# Patient Record
Sex: Male | Born: 1960 | Race: White | Hispanic: No | Marital: Single | Smoking: Former smoker
Health system: Southern US, Community
[De-identification: ages and names within clinical notes are randomized; demographics above are authoritative.]

## PROBLEM LIST (undated history)

## (undated) DIAGNOSIS — J449 Chronic obstructive pulmonary disease, unspecified: Secondary | ICD-10-CM

## (undated) DIAGNOSIS — I10 Essential (primary) hypertension: Secondary | ICD-10-CM

## (undated) DIAGNOSIS — E119 Type 2 diabetes mellitus without complications: Secondary | ICD-10-CM

---

## 2003-10-17 ENCOUNTER — Other Ambulatory Visit: Payer: Self-pay

## 2008-03-10 ENCOUNTER — Emergency Department: Payer: Self-pay | Admitting: Emergency Medicine

## 2020-05-15 ENCOUNTER — Inpatient Hospital Stay
Admission: EM | Admit: 2020-05-15 | Discharge: 2020-06-20 | DRG: 208 | Disposition: E | Payer: Medicaid Other | Attending: Pulmonary Disease | Admitting: Pulmonary Disease

## 2020-05-15 ENCOUNTER — Emergency Department: Payer: Medicaid Other

## 2020-05-15 ENCOUNTER — Other Ambulatory Visit: Payer: Self-pay

## 2020-05-15 DIAGNOSIS — R531 Weakness: Secondary | ICD-10-CM

## 2020-05-15 DIAGNOSIS — N179 Acute kidney failure, unspecified: Secondary | ICD-10-CM

## 2020-05-15 DIAGNOSIS — J189 Pneumonia, unspecified organism: Secondary | ICD-10-CM

## 2020-05-15 DIAGNOSIS — I468 Cardiac arrest due to other underlying condition: Secondary | ICD-10-CM | POA: Diagnosis not present

## 2020-05-15 DIAGNOSIS — E11649 Type 2 diabetes mellitus with hypoglycemia without coma: Secondary | ICD-10-CM | POA: Diagnosis not present

## 2020-05-15 DIAGNOSIS — J9601 Acute respiratory failure with hypoxia: Secondary | ICD-10-CM | POA: Diagnosis present

## 2020-05-15 DIAGNOSIS — Z4659 Encounter for fitting and adjustment of other gastrointestinal appliance and device: Secondary | ICD-10-CM

## 2020-05-15 DIAGNOSIS — G9341 Metabolic encephalopathy: Secondary | ICD-10-CM | POA: Diagnosis not present

## 2020-05-15 DIAGNOSIS — E43 Unspecified severe protein-calorie malnutrition: Secondary | ICD-10-CM | POA: Diagnosis not present

## 2020-05-15 DIAGNOSIS — J80 Acute respiratory distress syndrome: Secondary | ICD-10-CM | POA: Diagnosis present

## 2020-05-15 DIAGNOSIS — E869 Volume depletion, unspecified: Secondary | ICD-10-CM | POA: Diagnosis present

## 2020-05-15 DIAGNOSIS — E871 Hypo-osmolality and hyponatremia: Secondary | ICD-10-CM | POA: Diagnosis present

## 2020-05-15 DIAGNOSIS — Z91128 Patient's intentional underdosing of medication regimen for other reason: Secondary | ICD-10-CM

## 2020-05-15 DIAGNOSIS — J1282 Pneumonia due to Coronavirus disease 2019: Secondary | ICD-10-CM | POA: Diagnosis present

## 2020-05-15 DIAGNOSIS — I959 Hypotension, unspecified: Secondary | ICD-10-CM | POA: Diagnosis not present

## 2020-05-15 DIAGNOSIS — Z8249 Family history of ischemic heart disease and other diseases of the circulatory system: Secondary | ICD-10-CM

## 2020-05-15 DIAGNOSIS — N189 Chronic kidney disease, unspecified: Secondary | ICD-10-CM

## 2020-05-15 DIAGNOSIS — E1122 Type 2 diabetes mellitus with diabetic chronic kidney disease: Secondary | ICD-10-CM | POA: Diagnosis present

## 2020-05-15 DIAGNOSIS — E876 Hypokalemia: Secondary | ICD-10-CM | POA: Diagnosis present

## 2020-05-15 DIAGNOSIS — Z66 Do not resuscitate: Secondary | ICD-10-CM | POA: Diagnosis not present

## 2020-05-15 DIAGNOSIS — IMO0002 Reserved for concepts with insufficient information to code with codable children: Secondary | ICD-10-CM

## 2020-05-15 DIAGNOSIS — R7989 Other specified abnormal findings of blood chemistry: Secondary | ICD-10-CM

## 2020-05-15 DIAGNOSIS — R0602 Shortness of breath: Secondary | ICD-10-CM

## 2020-05-15 DIAGNOSIS — E440 Moderate protein-calorie malnutrition: Secondary | ICD-10-CM | POA: Insufficient documentation

## 2020-05-15 DIAGNOSIS — Z452 Encounter for adjustment and management of vascular access device: Secondary | ICD-10-CM

## 2020-05-15 DIAGNOSIS — E1165 Type 2 diabetes mellitus with hyperglycemia: Secondary | ICD-10-CM | POA: Diagnosis present

## 2020-05-15 DIAGNOSIS — I10 Essential (primary) hypertension: Secondary | ICD-10-CM | POA: Diagnosis present

## 2020-05-15 DIAGNOSIS — E878 Other disorders of electrolyte and fluid balance, not elsewhere classified: Secondary | ICD-10-CM | POA: Diagnosis present

## 2020-05-15 DIAGNOSIS — T383X6A Underdosing of insulin and oral hypoglycemic [antidiabetic] drugs, initial encounter: Secondary | ICD-10-CM | POA: Diagnosis present

## 2020-05-15 DIAGNOSIS — R296 Repeated falls: Secondary | ICD-10-CM | POA: Diagnosis present

## 2020-05-15 DIAGNOSIS — R0902 Hypoxemia: Secondary | ICD-10-CM

## 2020-05-15 DIAGNOSIS — Z6822 Body mass index (BMI) 22.0-22.9, adult: Secondary | ICD-10-CM

## 2020-05-15 DIAGNOSIS — U071 COVID-19: Secondary | ICD-10-CM | POA: Diagnosis present

## 2020-05-15 DIAGNOSIS — J440 Chronic obstructive pulmonary disease with acute lower respiratory infection: Secondary | ICD-10-CM | POA: Diagnosis present

## 2020-05-15 DIAGNOSIS — E872 Acidosis: Secondary | ICD-10-CM | POA: Diagnosis not present

## 2020-05-15 DIAGNOSIS — Z87891 Personal history of nicotine dependence: Secondary | ICD-10-CM | POA: Diagnosis not present

## 2020-05-15 DIAGNOSIS — B342 Coronavirus infection, unspecified: Secondary | ICD-10-CM

## 2020-05-15 DIAGNOSIS — Z833 Family history of diabetes mellitus: Secondary | ICD-10-CM

## 2020-05-15 DIAGNOSIS — R059 Cough, unspecified: Secondary | ICD-10-CM

## 2020-05-15 DIAGNOSIS — E1365 Other specified diabetes mellitus with hyperglycemia: Secondary | ICD-10-CM | POA: Diagnosis not present

## 2020-05-15 HISTORY — DX: Essential (primary) hypertension: I10

## 2020-05-15 HISTORY — DX: Chronic obstructive pulmonary disease, unspecified: J44.9

## 2020-05-15 HISTORY — DX: Type 2 diabetes mellitus without complications: E11.9

## 2020-05-15 LAB — COMPREHENSIVE METABOLIC PANEL
ALT: 16 U/L (ref 0–44)
AST: 28 U/L (ref 15–41)
Albumin: 2.6 g/dL — ABNORMAL LOW (ref 3.5–5.0)
Alkaline Phosphatase: 84 U/L (ref 38–126)
Anion gap: 18 — ABNORMAL HIGH (ref 5–15)
BUN: 86 mg/dL — ABNORMAL HIGH (ref 6–20)
CO2: 17 mmol/L — ABNORMAL LOW (ref 22–32)
Calcium: 8.1 mg/dL — ABNORMAL LOW (ref 8.9–10.3)
Chloride: 92 mmol/L — ABNORMAL LOW (ref 98–111)
Creatinine, Ser: 5.78 mg/dL — ABNORMAL HIGH (ref 0.61–1.24)
GFR calc Af Amer: 12 mL/min — ABNORMAL LOW (ref >60)
GFR calc non Af Amer: 10 mL/min — ABNORMAL LOW (ref >60)
Glucose, Bld: 321 mg/dL — ABNORMAL HIGH (ref 70–99)
Potassium: 3.1 mmol/L — ABNORMAL LOW (ref 3.5–5.1)
Sodium: 127 mmol/L — ABNORMAL LOW (ref 135–145)
Total Bilirubin: 1.1 mg/dL (ref 0.3–1.2)
Total Protein: 6.8 g/dL (ref 6.5–8.1)

## 2020-05-15 LAB — CBC WITH DIFFERENTIAL/PLATELET
Abs Immature Granulocytes: 0.05 10*3/uL (ref 0.00–0.07)
Basophils Absolute: 0 10*3/uL (ref 0.0–0.1)
Basophils Relative: 0 %
Eosinophils Absolute: 0 10*3/uL (ref 0.0–0.5)
Eosinophils Relative: 0 %
HCT: 36.8 % — ABNORMAL LOW (ref 39.0–52.0)
Hemoglobin: 13.2 g/dL (ref 13.0–17.0)
Immature Granulocytes: 1 %
Lymphocytes Relative: 11 %
Lymphs Abs: 0.8 10*3/uL (ref 0.7–4.0)
MCH: 28.6 pg (ref 26.0–34.0)
MCHC: 35.9 g/dL (ref 30.0–36.0)
MCV: 79.7 fL — ABNORMAL LOW (ref 80.0–100.0)
Monocytes Absolute: 0.3 10*3/uL (ref 0.1–1.0)
Monocytes Relative: 4 %
Neutro Abs: 5.9 10*3/uL (ref 1.7–7.7)
Neutrophils Relative %: 84 %
Platelets: 219 10*3/uL (ref 150–400)
RBC: 4.62 MIL/uL (ref 4.22–5.81)
RDW: 12.7 % (ref 11.5–15.5)
Smear Review: NORMAL
WBC: 7 10*3/uL (ref 4.0–10.5)
nRBC: 0 % (ref 0.0–0.2)

## 2020-05-15 LAB — BLOOD GAS, ARTERIAL
Acid-base deficit: 10.7 mmol/L — ABNORMAL HIGH (ref 0.0–2.0)
Bicarbonate: 12.4 mmol/L — ABNORMAL LOW (ref 20.0–28.0)
FIO2: 1
O2 Saturation: 99.1 %
Patient temperature: 37
pCO2 arterial: 22 mmHg — ABNORMAL LOW (ref 32.0–48.0)
pH, Arterial: 7.36 (ref 7.350–7.450)
pO2, Arterial: 141 mmHg — ABNORMAL HIGH (ref 83.0–108.0)

## 2020-05-15 LAB — RESPIRATORY PANEL BY RT PCR (FLU A&B, COVID)
Influenza A by PCR: NEGATIVE
Influenza B by PCR: NEGATIVE
SARS Coronavirus 2 by RT PCR: POSITIVE — AB

## 2020-05-15 LAB — BRAIN NATRIURETIC PEPTIDE: B Natriuretic Peptide: 80.5 pg/mL (ref 0.0–100.0)

## 2020-05-15 MED ORDER — GUAIFENESIN-DM 100-10 MG/5ML PO SYRP
10.0000 mL | ORAL_SOLUTION | ORAL | Status: DC | PRN
  Filled 2020-05-15: qty 10

## 2020-05-15 MED ORDER — SODIUM CHLORIDE 0.9 % IV BOLUS
2000.0000 mL | Freq: Once | INTRAVENOUS | Status: AC
  Administered 2020-05-15: 2000 mL via INTRAVENOUS

## 2020-05-15 MED ORDER — ASCORBIC ACID 500 MG PO TABS
500.0000 mg | ORAL_TABLET | Freq: Every day | ORAL | Status: DC
  Administered 2020-05-16 – 2020-05-24 (×10): 500 mg via ORAL
  Filled 2020-05-15 (×11): qty 1

## 2020-05-15 MED ORDER — ENOXAPARIN SODIUM 40 MG/0.4ML ~~LOC~~ SOLN: 40.0000 mg | SUBCUTANEOUS | Status: DC

## 2020-05-15 MED ORDER — SODIUM CHLORIDE 0.9 % IV SOLN: INTRAVENOUS | Status: DC

## 2020-05-15 MED ORDER — HYDROCOD POLST-CPM POLST ER 10-8 MG/5ML PO SUER: 5.0000 mL | Freq: Two times a day (BID) | ORAL | Status: DC | PRN

## 2020-05-15 MED ORDER — ONDANSETRON HCL 4 MG/2ML IJ SOLN: 4.0000 mg | Freq: Four times a day (QID) | INTRAMUSCULAR | Status: DC | PRN

## 2020-05-15 MED ORDER — ZINC SULFATE 220 (50 ZN) MG PO CAPS
220.0000 mg | ORAL_CAPSULE | Freq: Every day | ORAL | Status: DC
  Administered 2020-05-16 – 2020-05-24 (×10): 220 mg via ORAL
  Filled 2020-05-15 (×10): qty 1

## 2020-05-15 MED ORDER — ONDANSETRON HCL 4 MG PO TABS: 4.0000 mg | ORAL_TABLET | Freq: Four times a day (QID) | ORAL | Status: DC | PRN

## 2020-05-15 MED ORDER — ENOXAPARIN SODIUM 30 MG/0.3ML ~~LOC~~ SOLN
30.0000 mg | SUBCUTANEOUS | Status: DC
  Administered 2020-05-16 – 2020-05-19 (×5): 30 mg via SUBCUTANEOUS
  Filled 2020-05-15 (×5): qty 0.3

## 2020-05-15 MED ORDER — FAMOTIDINE 20 MG PO TABS
10.0000 mg | ORAL_TABLET | Freq: Every day | ORAL | Status: DC
  Administered 2020-05-16 – 2020-05-24 (×9): 10 mg via ORAL
  Filled 2020-05-15 (×9): qty 1

## 2020-05-15 MED ORDER — METHYLPREDNISOLONE SODIUM SUCC 125 MG IJ SOLR
1.0000 mg/kg | Freq: Two times a day (BID) | INTRAMUSCULAR | Status: AC
  Administered 2020-05-16 – 2020-05-18 (×6): 76.875 mg via INTRAVENOUS
  Filled 2020-05-15 (×6): qty 2

## 2020-05-15 MED ORDER — SODIUM CHLORIDE 0.9 % IV SOLN
200.0000 mg | Freq: Once | INTRAVENOUS | Status: AC
  Administered 2020-05-16: 200 mg via INTRAVENOUS
  Filled 2020-05-15: qty 40
  Filled 2020-05-15: qty 200

## 2020-05-15 MED ORDER — FAMOTIDINE 20 MG PO TABS: 20.0000 mg | ORAL_TABLET | Freq: Two times a day (BID) | ORAL | Status: DC

## 2020-05-15 MED ORDER — SODIUM CHLORIDE 0.9 % IV SOLN
100.0000 mg | Freq: Every day | INTRAVENOUS | Status: AC
  Administered 2020-05-16 – 2020-05-19 (×4): 100 mg via INTRAVENOUS
  Filled 2020-05-15 (×5): qty 20

## 2020-05-15 MED ORDER — MAGNESIUM HYDROXIDE 400 MG/5ML PO SUSP
30.0000 mL | Freq: Every day | ORAL | Status: DC | PRN
  Filled 2020-05-15: qty 30

## 2020-05-15 MED ORDER — DEXAMETHASONE SODIUM PHOSPHATE 10 MG/ML IJ SOLN
10.0000 mg | Freq: Once | INTRAMUSCULAR | Status: AC
  Administered 2020-05-15: 10 mg via INTRAVENOUS
  Filled 2020-05-15: qty 1

## 2020-05-15 MED ORDER — SODIUM CHLORIDE 0.9 % IV SOLN
100.0000 mg | Freq: Once | INTRAVENOUS | Status: AC
  Administered 2020-05-15: 100 mg via INTRAVENOUS
  Filled 2020-05-15: qty 100

## 2020-05-15 MED ORDER — ASPIRIN EC 81 MG PO TBEC
81.0000 mg | DELAYED_RELEASE_TABLET | Freq: Every day | ORAL | Status: DC
  Administered 2020-05-16 – 2020-05-24 (×10): 81 mg via ORAL
  Filled 2020-05-15 (×10): qty 1

## 2020-05-15 MED ORDER — ACETAMINOPHEN 325 MG PO TABS
650.0000 mg | ORAL_TABLET | Freq: Four times a day (QID) | ORAL | Status: DC | PRN
  Administered 2020-05-16 – 2020-05-21 (×5): 650 mg via ORAL
  Filled 2020-05-15 (×6): qty 2

## 2020-05-15 MED ORDER — BARICITINIB 2 MG PO TABS: 4.0000 mg | ORAL_TABLET | Freq: Every day | ORAL | Status: DC

## 2020-05-15 MED ORDER — TRAZODONE HCL 50 MG PO TABS
25.0000 mg | ORAL_TABLET | Freq: Every evening | ORAL | Status: DC | PRN
  Administered 2020-05-17 – 2020-05-22 (×4): 25 mg via ORAL
  Filled 2020-05-15 (×4): qty 1

## 2020-05-15 MED ORDER — GUAIFENESIN ER 600 MG PO TB12
600.0000 mg | ORAL_TABLET | Freq: Two times a day (BID) | ORAL | Status: DC
  Administered 2020-05-16 – 2020-05-24 (×19): 600 mg via ORAL
  Filled 2020-05-15 (×19): qty 1

## 2020-05-15 MED ORDER — PREDNISONE 50 MG PO TABS
50.0000 mg | ORAL_TABLET | Freq: Every day | ORAL | Status: DC
  Administered 2020-05-19 – 2020-05-20 (×2): 50 mg via ORAL
  Filled 2020-05-15 (×2): qty 1

## 2020-05-15 NOTE — Progress Notes (Signed)
Remdesivir - Pharmacy Brief Note   O:  CXR: early infiltrate SpO2: 100% on 15 L NRB   A/P:  Remdesivir 200 mg IVPB once followed by 100 mg IVPB daily x 4 days.   Clovia Cuff, PharmD, BCPS Jun 13, 2020 9:40 PM

## 2020-05-15 NOTE — H&P (Signed)
[place]   PATIENT NAME: Jackson Stark    MR#:  [id number]  DATE OF BIRTH:  Dec 11, 1960  DATE OF ADMISSION:  06-12-2020  PRIMARY CARE PHYSICIAN: No primary care provider on file.   REQUESTING/REFERRING PHYSICIAN: Merwyn Katos, MD  CHIEF COMPLAINT:   Chief Complaint  Patient presents with  . Shortness of Breath    HISTORY OF PRESENT ILLNESS:  Jackson Stark  is a 59 y.o. Caucasian male with a known history of COPD, hypertension and type 2 diabetes mellitus, who presented to the emergency room with acute onset of worsening dyspnea with associated dry cough with generalized weakness over the last couple weeks.  He has had recurrent falls during this time.  He denied any injuries.  He admits to nasal congestion without sore throat or earache.  He denied any loss of taste or smell.  Denies any fever or chills.  No nausea or vomiting or diarrhea.  Upon presentation to the emergency room, pulse oximetry was 80% on room air and get up to 93% on 10 L of O2 by nonrebreather with oth one-point erwise normal vital signs though respiratory rate is on was up to 29.  Labs revealed VBG with pH of 7.36 with bicarbonate of 12.4.  CMP was remarkable for hyponatremia hypokalemia and hypochloremia with hyperglycemia and a BUN of 86 with a creatinine of 5.78, anion gap of 18 and low albumin of 2.6 with total protein of 6.8.  BNP was 80.5.  CBC was unremarkable.  COVID-19 PCR came back positive and influenza antigens were negative.  Blood cultures were drawn.  Portable chest x-ray showed early left basal infiltrate. EKG showed sinus tachycardia with a rate of 101 with LVH.  The patient was given IV Decadron and doxycycline and 2 L bolus of IV normal saline.  He will be admitted to a progressive unit for further evaluation and management.  PAST MEDICAL HISTORY:   Past Medical History:  Diagnosis Date  . COPD (chronic obstructive pulmonary disease) (HCC)   . Diabetes mellitus without complication  (HCC)   . Hypertension     PAST SURGICAL HISTORY:  He denied any previous surgeries. SOCIAL HISTORY:   Social History   Tobacco Use  . Smoking status: Former Games developer  . Smokeless tobacco: Never Used  Substance Use Topics  . Alcohol use: Not Currently    FAMILY HISTORY:  Positive for diabetes mellitus and hypertension in his father.   DRUG ALLERGIES:  Not on File  REVIEW OF SYSTEMS:   ROS As per history of present illness. All pertinent systems were reviewed above. Constitutional, HEENT, cardiovascular, respiratory, GI, GU, musculoskeletal, neuro, psychiatric, endocrine, integumentary and hematologic systems were reviewed and are otherwise negative/unremarkable except for positive findings mentioned above in the HPI.   MEDICATIONS AT HOME:   Prior to Admission medications   Not on File      VITAL SIGNS:  Blood pressure (!) 157/80, pulse 90, temperature 97.8 F (36.6 C), temperature source Oral, resp. rate (!) 26, height 5\' 10"  (1.778 m), weight 77.1 kg, SpO2 93 %.  PHYSICAL EXAMINATION:  Physical Exam  GENERAL:  59 y.o.-year-old Caucasian male patient lying in the bed with mild respiratory stress with conversational dyspnea. EYES: Pupils equal, round, reactive to light and accommodation. No scleral icterus. Extraocular muscles intact.  HEENT: Head atraumatic, normocephalic. Oropharynx and nasopharynx clear.  NECK:  Supple, no jugular venous distention. No thyroid enlargement, no tenderness.  LUNGS: Diminished bibasal breath sounds with left  basal crackles.   CARDIOVASCULAR: Regular rate and rhythm, S1, S2 normal. No murmurs, rubs, or gallops.  ABDOMEN: Soft, nondistended, nontender. Bowel sounds present. No organomegaly or mass.  EXTREMITIES: No pedal edema, cyanosis, or clubbing.  NEUROLOGIC: Cranial nerves II through XII are intact. Muscle strength 5/5 in all extremities. Sensation intact. Gait not checked.  PSYCHIATRIC: The patient is alert and oriented x 3.   Normal affect and good eye contact. SKIN: No obvious rash, lesion, or ulcer.   LABORATORY PANEL:   CBC Recent Labs  Lab 05/11/2020 1714  WBC 7.0  HGB 13.2  HCT 36.8*  PLT 219   ------------------------------------------------------------------------------------------------------------------  Chemistries  Recent Labs  Lab 05/06/2020 1714  NA 127*  K 3.1*  CL 92*  CO2 17*  GLUCOSE 321*  BUN 86*  CREATININE 5.78*  CALCIUM 8.1*  AST 28  ALT 16  ALKPHOS 84  BILITOT 1.1   ------------------------------------------------------------------------------------------------------------------  Cardiac Enzymes No results for input(s): TROPONINI in the last 168 hours. ------------------------------------------------------------------------------------------------------------------  RADIOLOGY:  DG Chest Port 1 View  Result Date: 04/24/2020 CLINICAL DATA:  Shortness of breath EXAM: PORTABLE CHEST 1 VIEW COMPARISON:  None. FINDINGS: Cardiac shadow is within normal limits. The lungs are well aerated bilaterally. Diffuse increased density is noted in the left base consistent with early infiltrate. No bony abnormality is noted. IMPRESSION: Early infiltrate in the left base. Electronically Signed   By: Alcide Clever M.D.   On: 05/14/2020 17:39      IMPRESSION AND PLAN:  1.  Acute hypoxemic respiratory failure secondary to COVID-19. -The patient will be admitted to a medically monitored isolation bed. -O2 protocol will be followed to keep O2 saturation above 93.   2.  Multifocal pneumonia secondary to COVID-19. -The patient will be admitted to an isolation monitored bed with droplet and contact precautions. -Given multifocal pneumonia we will empirically place the patient on IV Rocephin and Zithromax for possible bacterial superinfection only with elevated Procalcitonin. -The patient will be placed on scheduled Mucinex and as needed Tussionex. -We will avoid nebulization as much as we can,  give bronchodilator MDI if needed, and with deterioration of oxygenation try to avoid BiPAP/CPAP if possible.    -Will obtain sputum Gram stain culture and sensitivity and follow blood cultures. -O2 protocol will be followed. -We will follow CRP, ferritin, LDH and D-dimer. -Will follow manual differential for ANC/ALC ratio as well as follow troponin I and daily CBC with manual differential and CMP. - Will place the patient on IV Remdesivir and IV steroid therapy with IV Solu-Medrol with elevated inflammatory markers. -The patient will be placed on vitamin D3, vitamin C, zinc sulfate, p.o. Pepcid and aspirin. -We will follow his acute kidney injury to see if he would qualify for baricitinib at some point as his GFR currently is less than 15.  3.  Acute kidney injury. -This could be acute superimposed on chronic kidney disease. -He will be hydrated with IV normal saline and will follow his BMP.  4.  Hypokalemia, hyponatremia and hypochloremia.. -This likely secondary to volume depletion with associated anorexia. -She will be hydrated with IV normal saline and will follow his BMP.  5.  DVT prophylaxis. -Subcutaneous Lovenox.    All the records are reviewed and case discussed with ED provider. The plan of care was discussed in details with the patient (and family). I answered all questions. The patient agreed to proceed with the above mentioned plan. Further management will depend upon hospital course.   CODE STATUS:  Full code  Status is: Inpatient  Remains inpatient appropriate because:Ongoing diagnostic testing needed not appropriate for outpatient work up, Unsafe d/c plan, IV treatments appropriate due to intensity of illness or inability to take PO and Inpatient level of care appropriate due to severity of illness   Dispo: The patient is from: Home              Anticipated d/c is to: Home              Anticipated d/c date is: > 3 days              Patient currently is not  medically stable to d/c.   TOTAL TIME TAKING CARE OF THIS PATIENT: 55 minutes.    Hannah Beat M.D on 17-May-2020 at 7:54 PM  [facility]   From 7 PM-7 AM, contact night-coverage [email or web address]  CC: Primary care physician; No primary care provider on file.

## 2020-05-15 NOTE — ED Provider Notes (Signed)
[facility] Emergency Department Provider Note   ____________________________________________   First MD Initiated Contact with Patient Jun 01, 2020 1707     (approximate)  I have reviewed the triage vital signs and the nursing notes.   HISTORY  Chief Complaint Shortness of Breath    HPI Jackson Stark is a 59 y.o. male with a past medical history of COPD, diabetes, and hypertension who presents via EMS for increasing weakness over the last 2 weeks.  Patient states that he has been off all medications for his chronic medical conditions for the past 6 months due to losing his primary care physician and being unable to get new prescriptions.  EMS states that they found patient with oxygen saturations of 87 on room air that slightly improved with nonrebreather at 15 L.  Patient denies any exacerbating or relieving factors for this weakness and states that with any ambulation he becomes extremely lightheaded and "loses strength" in his bilateral lower extremities causing him to fall.  Patient states that he has had 5-10 falls per day over the last 2 weeks.  Patient denies any head trauma or loss of consciousness during these falls.         Past Medical History:  Diagnosis Date  . COPD (chronic obstructive pulmonary disease) (HCC)   . Diabetes mellitus without complication (HCC)   . Hypertension     There are no problems to display for this patient.   History reviewed. No pertinent surgical history.  Prior to Admission medications   Not on File    Allergies Patient has no allergy information on record.  History reviewed. No pertinent family history.  Social History Social History   Tobacco Use  . Smoking status: Former Games developer  . Smokeless tobacco: Never Used  Substance Use Topics  . Alcohol use: Not Currently  . Drug use: Not Currently    Review of Systems Constitutional: Endorses fever/chills Eyes: No visual changes. ENT: No sore  throat. Cardiovascular: Denies chest pain. Respiratory: Endorses shortness of breath. Gastrointestinal: No abdominal pain.  No nausea, no vomiting.  No diarrhea. Genitourinary: Negative for dysuria. Musculoskeletal: Negative for acute arthralgias Skin: Negative for rash. Neurological: Negative for headaches, weakness/numbness/paresthesias in any extremity Psychiatric: Negative for suicidal ideation/homicidal ideation   ____________________________________________   PHYSICAL EXAM:  VITAL SIGNS: ED Triage Vitals  Enc Vitals Group     BP --      Pulse --      Resp --      Temp --      Temp src --      SpO2 2020/06/01 1706 (!) 80 %     Weight 2020/06/01 1707 170 lb (77.1 kg)     Height 06-01-2020 1707 5\' 10"  (1.778 m)     Head Circumference --      Peak Flow --      Pain Score 06/01/20 1707 8     Pain Loc --      Pain Edu? --      Excl. in GC? --    Constitutional: Alert and oriented. Well appearing and in no acute distress. Eyes: Conjunctivae are normal. PERRL. Head: Atraumatic. Nose: No congestion/rhinnorhea. Mouth/Throat: Mucous membranes are moist. Neck: No stridor Cardiovascular: Grossly normal heart sounds.  Good peripheral circulation. Respiratory: Normal respiratory effort.  Rhonchi to bilateral lower lung fields.  10 L on nonrebreather Gastrointestinal: Soft and nontender. No distention. Musculoskeletal: No obvious deformities Neurologic:  Normal speech and language. No gross focal neurologic deficits are  appreciated. Skin:  Skin is warm and dry. No rash noted. Psychiatric: Mood and affect are normal. Speech and behavior are normal.  ____________________________________________   LABS (all labs ordered are listed, but only abnormal results are displayed)  Labs Reviewed  RESPIRATORY PANEL BY RT PCR (FLU A&B, COVID) - Abnormal; Notable for the following components:      Result Value   SARS Coronavirus 2 by RT PCR POSITIVE (*)    All other components within normal  limits  CBC WITH DIFFERENTIAL/PLATELET - Abnormal; Notable for the following components:   HCT 36.8 (*)    MCV 79.7 (*)    All other components within normal limits  COMPREHENSIVE METABOLIC PANEL - Abnormal; Notable for the following components:   Sodium 127 (*)    Potassium 3.1 (*)    Chloride 92 (*)    CO2 17 (*)    Glucose, Bld 321 (*)    BUN 86 (*)    Creatinine, Ser 5.78 (*)    Calcium 8.1 (*)    Albumin 2.6 (*)    GFR calc non Af Amer 10 (*)    GFR calc Af Amer 12 (*)    Anion gap 18 (*)    All other components within normal limits  BLOOD GAS, ARTERIAL - Abnormal; Notable for the following components:   pCO2 arterial 22 (*)    pO2, Arterial 141 (*)    Bicarbonate 12.4 (*)    Acid-base deficit 10.7 (*)    All other components within normal limits  CULTURE, BLOOD (ROUTINE X 2)  CULTURE, BLOOD (ROUTINE X 2)  BRAIN NATRIURETIC PEPTIDE   ____________________________________________  EKG  ED ECG REPORT I, Merwyn Katos, the attending physician, personally viewed and interpreted this ECG.  Date: 2020-05-16 EKG Time: 1708 Rate: 101 Rhythm: Tachycardic sinus rhythm QRS Axis: normal Intervals: normal ST/T Wave abnormalities: normal Narrative Interpretation: no evidence of acute ischemia  ____________________________________________  RADIOLOGY  ED MD interpretation: One-view portable x-ray of the chest shows likely early infiltrate at the left base without any evidence of pneumothorax or widened mediastinum  Official radiology report(s): DG Chest Port 1 View  Result Date: 2020/05/16 CLINICAL DATA:  Shortness of breath EXAM: PORTABLE CHEST 1 VIEW COMPARISON:  None. FINDINGS: Cardiac shadow is within normal limits. The lungs are well aerated bilaterally. Diffuse increased density is noted in the left base consistent with early infiltrate. No bony abnormality is noted. IMPRESSION: Early infiltrate in the left base. Electronically Signed   By: Alcide Clever M.D.   On:  May 16, 2020 17:39    ____________________________________________   PROCEDURES  Procedure(s) performed (including Critical Care):  .Critical Care Performed by: Merwyn Katos, MD Authorized by: Merwyn Katos, MD   Critical care provider statement:    Critical care time (minutes):  53   Critical care was necessary to treat or prevent imminent or life-threatening deterioration of the following conditions:  Respiratory failure   Critical care was time spent personally by me on the following activities:  Discussions with consultants, evaluation of patient's response to treatment, examination of patient, ordering and performing treatments and interventions, ordering and review of laboratory studies, ordering and review of radiographic studies, pulse oximetry, re-evaluation of patient's condition, obtaining history from patient or surrogate and review of old charts   I assumed direction of critical care for this patient from another provider in my specialty: no   .1-3 Lead EKG Interpretation Performed by: Merwyn Katos, MD Authorized by: Merwyn Katos, MD  Interpretation: normal     ECG rate:  92   ECG rate assessment: normal     Rhythm: sinus rhythm     Ectopy: none     Conduction: normal       ____________________________________________   INITIAL IMPRESSION / ASSESSMENT AND PLAN / ED COURSE  As part of my medical decision making, I reviewed the following data within the electronic MEDICAL RECORD NUMBER Nursing notes reviewed and incorporated, Labs reviewed, EKG interpreted, Old chart reviewed, Radiograph reviewed and Notes from prior ED visits reviewed and incorporated        Presentation most consistent with Viral Syndrome.  Patient has tested positive for COVID-19. At this time patient is requiring submental oxygenation due to acute hypoxic respiratory failure.  Given History and Exam I have a lower suspicion for: Emergent CardioPulmonary causes [such as Acute Asthma  or COPD Exacerbation, acute Heart Failure or exacerbation, PE, PTX, atypical ACS, PNA]. Emergent Otolaryngeal causes [such as PTA, RPA, Ludwigs, Epiglottitis, EBV].  Regarding Emergent Travel or Immunosuppressive related infectious: I have a low suspicion for acute HIV.  Given radiologic evidence for likely early left lower lobe infiltrate, continued need for supplemental oxygenation due to acute hypoxic respiratory failure, and need for further evaluation and management, patient will require admission      ____________________________________________   FINAL CLINICAL IMPRESSION(S) / ED DIAGNOSES  Final diagnoses:  Acute respiratory failure with hypoxia (HCC)  Coronavirus infection  Community acquired pneumonia of left lung, unspecified part of lung  Generalized weakness  Cough in adult patient     ED Discharge Orders    None       Note:  This document was prepared using Dragon voice recognition software and may include unintentional dictation errors.   Merwyn Katos, MD 2020-05-23 1949

## 2020-05-15 NOTE — ED Triage Notes (Addendum)
Pt arrives via ems from home c/o SOB, and increase weakness over last couple weeks. EMS report pt oxygen sat 87 room air @ home, increased 2L [place] no increase non re breather increased pt to 92-93%. Ems states pt has not taken any medications in around 6 months, hx htn, diabetes.   NAD noted on arrival. MD present for assessment   cbg 325 per ems 98.4 temp

## 2020-05-15 NOTE — ED Notes (Signed)
Date and time results received: 05/25/2020    Test: covid Critical Value: positive  Name of Provider Notified: bradler  Orders Received? Or Actions Taken?: MD notified

## 2020-05-16 ENCOUNTER — Inpatient Hospital Stay: Payer: Medicaid Other

## 2020-05-16 DIAGNOSIS — J9601 Acute respiratory failure with hypoxia: Secondary | ICD-10-CM | POA: Diagnosis not present

## 2020-05-16 DIAGNOSIS — E1365 Other specified diabetes mellitus with hyperglycemia: Secondary | ICD-10-CM | POA: Diagnosis not present

## 2020-05-16 DIAGNOSIS — U071 COVID-19: Secondary | ICD-10-CM | POA: Diagnosis not present

## 2020-05-16 DIAGNOSIS — N179 Acute kidney failure, unspecified: Secondary | ICD-10-CM | POA: Diagnosis not present

## 2020-05-16 LAB — CBC WITH DIFFERENTIAL/PLATELET
Abs Immature Granulocytes: 0.03 10*3/uL (ref 0.00–0.07)
Basophils Absolute: 0 10*3/uL (ref 0.0–0.1)
Basophils Relative: 0 %
Eosinophils Absolute: 0 10*3/uL (ref 0.0–0.5)
Eosinophils Relative: 0 %
HCT: 32.7 % — ABNORMAL LOW (ref 39.0–52.0)
Hemoglobin: 11.8 g/dL — ABNORMAL LOW (ref 13.0–17.0)
Immature Granulocytes: 1 %
Lymphocytes Relative: 12 %
Lymphs Abs: 0.4 10*3/uL — ABNORMAL LOW (ref 0.7–4.0)
MCH: 28.7 pg (ref 26.0–34.0)
MCHC: 36.1 g/dL — ABNORMAL HIGH (ref 30.0–36.0)
MCV: 79.6 fL — ABNORMAL LOW (ref 80.0–100.0)
Monocytes Absolute: 0.1 10*3/uL (ref 0.1–1.0)
Monocytes Relative: 2 %
Neutro Abs: 2.5 10*3/uL (ref 1.7–7.7)
Neutrophils Relative %: 85 %
Platelets: 168 10*3/uL (ref 150–400)
RBC: 4.11 MIL/uL — ABNORMAL LOW (ref 4.22–5.81)
RDW: 13 % (ref 11.5–15.5)
Smear Review: NORMAL
WBC: 2.9 10*3/uL — ABNORMAL LOW (ref 4.0–10.5)
nRBC: 0 % (ref 0.0–0.2)

## 2020-05-16 LAB — HEPATITIS C ANTIBODY: HCV Ab: NONREACTIVE

## 2020-05-16 LAB — COMPREHENSIVE METABOLIC PANEL
ALT: 15 U/L (ref 0–44)
AST: 23 U/L (ref 15–41)
Albumin: 2.3 g/dL — ABNORMAL LOW (ref 3.5–5.0)
Alkaline Phosphatase: 74 U/L (ref 38–126)
Anion gap: 18 — ABNORMAL HIGH (ref 5–15)
BUN: 93 mg/dL — ABNORMAL HIGH (ref 6–20)
CO2: 15 mmol/L — ABNORMAL LOW (ref 22–32)
Calcium: 7.8 mg/dL — ABNORMAL LOW (ref 8.9–10.3)
Chloride: 95 mmol/L — ABNORMAL LOW (ref 98–111)
Creatinine, Ser: 5.59 mg/dL — ABNORMAL HIGH (ref 0.61–1.24)
GFR calc Af Amer: 12 mL/min — ABNORMAL LOW (ref >60)
GFR calc non Af Amer: 10 mL/min — ABNORMAL LOW (ref >60)
Glucose, Bld: 570 mg/dL (ref 70–99)
Potassium: 3.6 mmol/L (ref 3.5–5.1)
Sodium: 128 mmol/L — ABNORMAL LOW (ref 135–145)
Total Bilirubin: 1 mg/dL (ref 0.3–1.2)
Total Protein: 5.9 g/dL — ABNORMAL LOW (ref 6.5–8.1)

## 2020-05-16 LAB — HEPATITIS B SURFACE ANTIGEN: Hepatitis B Surface Ag: NONREACTIVE

## 2020-05-16 LAB — FIBRIN DERIVATIVES D-DIMER (ARMC ONLY): Fibrin derivatives D-dimer (ARMC): 1079.08 ng/mL (FEU) — ABNORMAL HIGH (ref 0.00–499.00)

## 2020-05-16 LAB — GLUCOSE, CAPILLARY
Glucose-Capillary: 488 mg/dL — ABNORMAL HIGH (ref 70–99)
Glucose-Capillary: 510 mg/dL (ref 70–99)
Glucose-Capillary: 390 mg/dL — ABNORMAL HIGH (ref 70–99)
Glucose-Capillary: 281 mg/dL — ABNORMAL HIGH (ref 70–99)
Glucose-Capillary: 238 mg/dL — ABNORMAL HIGH (ref 70–99)

## 2020-05-16 LAB — HEMOGLOBIN A1C
Hgb A1c MFr Bld: 13.8 % — ABNORMAL HIGH (ref 4.8–5.6)
Mean Plasma Glucose: 349.36 mg/dL

## 2020-05-16 LAB — HEPATITIS B CORE ANTIBODY, TOTAL: Hep B Core Total Ab: NONREACTIVE

## 2020-05-16 LAB — C-REACTIVE PROTEIN: CRP: 8.7 mg/dL — ABNORMAL HIGH (ref <1.0)

## 2020-05-16 LAB — PROTEIN / CREATININE RATIO, URINE
Creatinine, Urine: 46 mg/dL
Protein Creatinine Ratio: 3.04 mg/mg{Cre} — ABNORMAL HIGH (ref 0.00–0.15)
Total Protein, Urine: 140 mg/dL

## 2020-05-16 LAB — HEPATITIS B SURFACE ANTIBODY,QUALITATIVE: Hep B S Ab: NONREACTIVE

## 2020-05-16 LAB — HIV ANTIBODY (ROUTINE TESTING W REFLEX): HIV Screen 4th Generation wRfx: NONREACTIVE

## 2020-05-16 MED ORDER — NEPRO/CARBSTEADY PO LIQD
237.0000 mL | Freq: Three times a day (TID) | ORAL | Status: DC
  Administered 2020-05-17 – 2020-05-23 (×15): 237 mL via ORAL

## 2020-05-16 MED ORDER — INFLUENZA VAC SPLIT QUAD 0.5 ML IM SUSY
0.5000 mL | PREFILLED_SYRINGE | INTRAMUSCULAR | Status: DC
  Filled 2020-05-16: qty 0.5

## 2020-05-16 MED ORDER — INSULIN ASPART 100 UNIT/ML ~~LOC~~ SOLN
0.0000 [IU] | Freq: Every day | SUBCUTANEOUS | Status: DC
  Administered 2020-05-16: 3 [IU] via SUBCUTANEOUS
  Filled 2020-05-16 (×2): qty 1

## 2020-05-16 MED ORDER — LIVING WELL WITH DIABETES BOOK
Freq: Once | Status: AC
  Filled 2020-05-16: qty 1

## 2020-05-16 MED ORDER — INSULIN ASPART 100 UNIT/ML ~~LOC~~ SOLN
0.0000 [IU] | Freq: Three times a day (TID) | SUBCUTANEOUS | Status: DC
  Administered 2020-05-16: 17:00:00 8 [IU] via SUBCUTANEOUS
  Administered 2020-05-16 – 2020-05-17 (×3): 15 [IU] via SUBCUTANEOUS
  Administered 2020-05-17: 5 [IU] via SUBCUTANEOUS
  Administered 2020-05-18 (×2): 15 [IU] via SUBCUTANEOUS
  Filled 2020-05-16 (×6): qty 1

## 2020-05-16 MED ORDER — HYDRALAZINE HCL 50 MG PO TABS
25.0000 mg | ORAL_TABLET | Freq: Three times a day (TID) | ORAL | Status: DC
  Administered 2020-05-16 – 2020-05-17 (×6): 25 mg via ORAL
  Filled 2020-05-16 (×6): qty 1

## 2020-05-16 MED ORDER — SODIUM CHLORIDE 0.9 % IV SOLN: INTRAVENOUS | Status: DC

## 2020-05-16 MED ORDER — INSULIN GLARGINE 100 UNIT/ML ~~LOC~~ SOLN
10.0000 [IU] | Freq: Two times a day (BID) | SUBCUTANEOUS | Status: DC
  Administered 2020-05-16 (×2): 10 [IU] via SUBCUTANEOUS
  Filled 2020-05-16 (×4): qty 0.1

## 2020-05-16 MED ORDER — PNEUMOCOCCAL VAC POLYVALENT 25 MCG/0.5ML IJ INJ: 0.5000 mL | INJECTION | INTRAMUSCULAR | Status: DC

## 2020-05-16 MED ORDER — ADULT MULTIVITAMIN W/MINERALS CH
1.0000 | ORAL_TABLET | Freq: Every day | ORAL | Status: DC
  Administered 2020-05-17 – 2020-05-24 (×8): 1 via ORAL
  Filled 2020-05-16 (×9): qty 1

## 2020-05-16 MED ORDER — SODIUM CHLORIDE 0.45 % IV SOLN
INTRAVENOUS | Status: DC
  Filled 2020-05-16 (×5): qty 1000

## 2020-05-16 MED ORDER — INSULIN ASPART 100 UNIT/ML ~~LOC~~ SOLN
25.0000 [IU] | Freq: Once | SUBCUTANEOUS | Status: AC
  Administered 2020-05-16: 08:00:00 25 [IU] via SUBCUTANEOUS
  Filled 2020-05-16: qty 1

## 2020-05-16 MED ORDER — INSULIN ASPART 100 UNIT/ML ~~LOC~~ SOLN
10.0000 [IU] | Freq: Once | SUBCUTANEOUS | Status: AC
  Administered 2020-05-16: 10 [IU] via SUBCUTANEOUS
  Filled 2020-05-16: qty 1

## 2020-05-16 MED ORDER — INSULIN STARTER KIT- PEN NEEDLES (ENGLISH)
1.0000 | Freq: Once | Status: AC
  Administered 2020-05-16: 22:00:00 1
  Filled 2020-05-16: qty 1

## 2020-05-16 NOTE — Progress Notes (Addendum)
Inpatient Diabetes Program Recommendations  AACE/ADA: New Consensus Statement on Inpatient Glycemic Control (2015)  Target Ranges:  Prepandial:   less than 140 mg/dL      Peak postprandial:   less than 180 mg/dL (1-2 hours)      Critically ill patients:  140 - 180 mg/dL   Lab Results  Component Value Date   GLUCAP 510 ([place]) 05/16/2020    Review of Glycemic Control Results for ALMOND, FITZGIBBON (MRN [id number]) as of 05/16/2020 09:47  Ref. Range 05/16/2020 07:33  Glucose-Capillary Latest Ref Range: 70 - 99 mg/dL 510 ([facility])   Diabetes history: None noted Outpatient Diabetes medications:  None Current orders for Inpatient glycemic control:  Lantus 10 units bid, Novolog moderate tid with meals and HS- Solumedrol 77 mg bid  Inpatient Diabetes Program Recommendations:    Agree with current orders.  Note that A1C is pending.  No previous history of DM noted.  Although, admission glucose was 321 mg/dL.  Will follow.   Thanks, Adah Perl, RN, BC-ADM Inpatient Diabetes Coordinator Pager [phone] (8a-5p)  Addendum 1520:  Called patient to discuss A1C of 13.8%.  Patient states that this is not a new diagnosis for him and that he has been on both Metformin and Glipizide in the past.  He currently does not have a glucose meter at home and will need new Rx. At d/c.  Discussed goal A1C of 7% and that he will likely need insulin at d/c to control his blood sugars. Ordered patient Living well with DM booklet and insulin starter kit.  Told him that RN would be practicing insulin administration with him with appropriate.   Patient verbalized understanding. Will follow.

## 2020-05-16 NOTE — TOC Initial Note (Signed)
Transition of Care [facility]) - Initial/Assessment Note    Patient Details  Name: Jackson Stark MRN: [id number] Date of Birth: November 12, 1960  [facility] CM/SW Contact:    Bing Quarry, RN Phone Number: 05/16/2020, 11:27 AM  Clinical Narrative:                 Pt admitted with Dx of Covid-19. Pt currently on HFNC 7L [place]. States he is feeling a little better today after the oxygen was ap. Was only able to walk 10 feet before collapsing according to patient (without PT). Pt lives alone, independent at baseline, no assistive devices, has a girlfriend listed in contact info, permission to speak to dischaege agencies such as [employer]. Had not used a pharmacy before but [facility] [address] (added to demographics) Pt. Does not have a PCP. Discussed need for patient to contact Medicaid to arrange for a PCP. He stated provider was also going to "help him get a doctor to see". Pt does not drive but states he had a neighbor that can take him to appointments, etc. CM will continue to follow patient and told patient CM would be following up as his hospitalization/condition progresses. Encourage him to reach out via his care team if he has questions or need to speak to CM. Noni Saupe RN CM.  Expected Discharge Plan: Home/Self Care Barriers to Discharge: Continued Medical Work up   Patient Goals and CMS Choice Patient states their goals for this hospitalization and ongoing recovery are:: To be able to walk around without collapsing      Expected Discharge Plan and Services Expected Discharge Plan: Home/Self Care   Discharge Planning Services: CM Consult   Living arrangements for the past 2 months: Single Family Home                                      Prior Living Arrangements/Services Living arrangements for the past 2 months: Single Family Home Lives with:: Self Patient language and need for interpreter reviewed:: No Do you feel safe going back to the place where you  live?: Yes               Activities of Daily Living Home Assistive Devices/Equipment: Cane (specify quad or straight) ADL Screening (condition at time of admission) Patient's cognitive ability adequate to safely complete daily activities?: Yes Is the patient deaf or have difficulty hearing?: Yes Does the patient have difficulty seeing, even when wearing glasses/contacts?: No Does the patient have difficulty concentrating, remembering, or making decisions?: Yes Patient able to express need for assistance with ADLs?: Yes Does the patient have difficulty dressing or bathing?: No Independently performs ADLs?: Yes (appropriate for developmental age) Does the patient have difficulty walking or climbing stairs?: Yes Weakness of Legs: Both (last 6 weeks) Weakness of Arms/Hands: None  Permission Sought/Granted Permission sought to share information with : Case Manager Permission granted to share information with : Yes, Verbal Permission Granted     Permission granted to share info w AGENCY: [employer]  Permission granted to share info w Relationship: Shella Maxim [phone] Girlfriend     Emotional Assessment   Attitude/Demeanor/Rapport: Engaged Affect (typically observed): Accepting Orientation: : Oriented to Self, Oriented to Place, Oriented to Situation, Oriented to  Time Alcohol / Substance Use: Not Applicable    Admission diagnosis:  Acute respiratory failure with hypoxia (HCC) [J96.01] Coronavirus infection [  B34.2] Generalized weakness [R53.1] Cough in adult patient [R05] Community acquired pneumonia of left lung, unspecified part of lung [J18.9] Acute hypoxemic respiratory failure due to COVID-19 (HCC) [U07.1, J96.01] Patient Active Problem List   Diagnosis Date Noted  . Acute hypoxemic respiratory failure due to COVID-19 [facility]) 05/07/2020   PCP:  Patient, No Pcp Per Pharmacy:   [facility] [address] [place] - [address] [place]  [address] Phone: [phone] Fax: [phone]     Social Determinants of Health (SDOH) Interventions    Readmission Risk Interventions No flowsheet data found.

## 2020-05-16 NOTE — Consult Note (Signed)
[address] [place], [place] [address] Phone [phone]. Fax [phone]  Date: 05/16/2020                  Patient Name:  Jackson Stark  MRN: [id number]  DOB: September 19, 1960  Age / Sex: 59 y.o., male         PCP: Patient, No Pcp Per                 Service Requesting Consult: IM/ Enedina Finner, MD                 Reason for Consult: ARF            History of Present Illness: Patient is a 59 y.o. male   Patient presented from home for shortness of breath and increasing weakness.  Hypoxic on room air as per EMS.  Diagnosed with Covid pneumonia.  He has recurrent falls, nasal congestion.  Lab results showed acidosis, hyponatremia, hypokalemia, increased creatinine, low albumin.  Patient is now admitted for further evaluation and management. Found to have poorly controlled diabetes.  Hemoglobin A1c 13.8% No care everywhere records are available  Patient states he has been diabetic for over 20 years. For the last 1 year or so he has not taken any medication for diabetes. 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 Quit smoking 2 weeks ago Currently unemployed. Used to work in Holiday representative No leg edema    Medications: Outpatient medications: No medications prior to admission.    Current medications: Current Facility-Administered Medications  Medication Dose Route Frequency Provider Last Rate Last Admin  . 0.9 %  sodium chloride infusion   Intravenous Continuous Enedina Finner, MD 75 mL/hr at 05/16/20 0748 New Bag at 05/16/20 0748  . acetaminophen (TYLENOL) tablet 650 mg  650 mg Oral Q6H PRN Mansy, Jan A, MD   650 mg at 05/16/20 0522  . ascorbic acid (VITAMIN C) tablet 500 mg  500 mg Oral Daily Mansy, Jan A, MD   500 mg at 05/16/20 0959  . aspirin EC tablet 81 mg  81 mg Oral Daily Mansy, Jan A, MD   81 mg at 05/16/20 0957  . chlorpheniramine-HYDROcodone (TUSSIONEX) 10-8 MG/5ML suspension 5 mL  5 mL Oral Q12H PRN Mansy, Jan A, MD      . enoxaparin (LOVENOX) injection  30 mg  30 mg Subcutaneous Q24H Mansy, Jan A, MD   30 mg at 05/16/20 0047  . famotidine (PEPCID) tablet 10 mg  10 mg Oral Daily Mansy, Jan A, MD   10 mg at 05/16/20 0957  . guaiFENesin (MUCINEX) 12 hr tablet 600 mg  600 mg Oral BID Mansy, Jan A, MD   600 mg at 05/16/20 0957  . guaiFENesin-dextromethorphan (ROBITUSSIN DM) 100-10 MG/5ML syrup 10 mL  10 mL Oral Q4H PRN Mansy, Jan A, MD      . hydrALAZINE (APRESOLINE) tablet 25 mg  25 mg Oral TID Enedina Finner, MD   25 mg at 05/16/20 1356  . [START ON 05/17/2020] influenza vac split quadrivalent PF (FLUARIX) injection 0.5 mL  0.5 mL Intramuscular Tomorrow-1000 Ouma, Hubbard Hartshorn, NP      . insulin aspart (novoLOG) injection 0-15 Units  0-15 Units Subcutaneous TID WC Enedina Finner, MD   15 Units at 05/16/20 1223  . insulin aspart (novoLOG) injection 0-5 Units  0-5 Units Subcutaneous QHS Enedina Finner, MD      . insulin glargine (LANTUS) injection 10 Units  10 Units Subcutaneous BID Enedina Finner, MD  10 Units at 05/16/20 0958  . magnesium hydroxide (MILK OF MAGNESIA) suspension 30 mL  30 mL Oral Daily PRN Mansy, Jan A, MD      . methylPREDNISolone sodium succinate (SOLU-MEDROL) 125 mg/2 mL injection 76.875 mg  1 mg/kg Intravenous Q12H Mansy, Jan A, MD   76.875 mg at 05/16/20 1003   Followed by  . [START ON 05/19/2020] predniSONE (DELTASONE) tablet 50 mg  50 mg Oral Daily Mansy, Jan A, MD      . ondansetron [facility]) tablet 4 mg  4 mg Oral Q6H PRN Mansy, Jan A, MD       Or  . ondansetron [facility]) injection 4 mg  4 mg Intravenous Q6H PRN Mansy, Jan A, MD      . Melene Muller ON 05/17/2020] pneumococcal 23 valent vaccine (PNEUMOVAX-23) injection 0.5 mL  0.5 mL Intramuscular Tomorrow-1000 Jimmye Norman, NP      . remdesivir 100 mg in sodium chloride 0.9 % 100 mL IVPB  100 mg Intravenous Daily Mansy, Jan A, MD 200 mL/hr at 05/16/20 0956 100 mg at 05/16/20 0956  . traZODone (DESYREL) tablet 25 mg  25 mg Oral QHS PRN Mansy, Jan A, MD      . zinc sulfate capsule  220 mg  220 mg Oral Daily Mansy, Jan A, MD   220 mg at 05/16/20 0370      Allergies: Not on File    Past Medical History: Past Medical History:  Diagnosis Date  . COPD (chronic obstructive pulmonary disease) (HCC)   . Diabetes mellitus without complication (HCC)   . Hypertension      Past Surgical History: History reviewed. No pertinent surgical history.   Family History: History reviewed. No pertinent family history.   Social History: Social History   Socioeconomic History  . Marital status: Single    Spouse name: Not on file  . Number of children: Not on file  . Years of education: Not on file  . Highest education level: Not on file  Occupational History  . Not on file  Tobacco Use  . Smoking status: Former Games developer  . Smokeless tobacco: Never Used  Substance and Sexual Activity  . Alcohol use: Not Currently  . Drug use: Not Currently  . Sexual activity: Not on file  Other Topics Concern  . Not on file  Social History Narrative  . Not on file   Social Determinants of Health   Financial Resource Strain:   . Difficulty of Paying Living Expenses: Not on file  Food Insecurity:   . Worried About Programme researcher, broadcasting/film/video in the Last Year: Not on file  . Ran Out of Food in the Last Year: Not on file  Transportation Needs:   . Lack of Transportation (Medical): Not on file  . Lack of Transportation (Non-Medical): Not on file  Physical Activity:   . Days of Exercise per Week: Not on file  . Minutes of Exercise per Session: Not on file  Stress:   . Feeling of Stress : Not on file  Social Connections:   . Frequency of Communication with Friends and Family: Not on file  . Frequency of Social Gatherings with Friends and Family: Not on file  . Attends Religious Services: Not on file  . Active Member of Clubs or Organizations: Not on file  . Attends Banker Meetings: Not on file  . Marital Status: Not on file  Intimate Partner Violence:   . Fear of  Current or Ex-Partner: Not  on file  . Emotionally Abused: Not on file  . Physically Abused: Not on file  . Sexually Abused: Not on file     Review of Systems: Gen: Denies any fevers or chills HEENT: No vision or hearing complaints CV: No chest pain or shortness of breath Resp: No hemoptysis GI: Appetite is fair GU : Able to void without any problem MS: No complaints Derm:    Multiple tattoos. No complaints Psych: No complaints Heme: No complaints Neuro:. No complaints Endocrine. Poorly controlled diabetes  Vital Signs: Blood pressure (!) 172/84, pulse 78, temperature 97.8 F (36.6 C), temperature source Oral, resp. rate 16, height 5\' 10"  (1.778 m), weight 77.1 kg, SpO2 93 %.   Intake/Output Summary (Last 24 hours) at 05/16/2020 1405 Last data filed at 05/16/2020 0730 Gross per 24 hour  Intake 2365 ml  Output 525 ml  Net 1840 ml    Weight trends: 05/18/2020   05/09/2020 1707  Weight: 77.1 kg   Physical Exam: General:  No acute distress, laying in the bed  HEENT  anicteric, moist oral mucous membrane  Pulm/lungs  normal breathing effort, lungs are clear to auscultation  CVS/Heart  regular rhythm, no rub or gallop  Abdomen:   Soft, nontender  Extremities:  No peripheral edema  Neurologic:  Alert, oriented, able to follow commands  Skin:  No acute rashes, multiple tattoos    Lab results: Basic Metabolic Panel: Recent Labs  Lab 05/07/2020 1714 05/16/20 0601  NA 127* 128*  K 3.1* 3.6  CL 92* 95*  CO2 17* 15*  GLUCOSE 321* 570*  BUN 86* 93*  CREATININE 5.78* 5.59*  CALCIUM 8.1* 7.8*    Liver Function Tests: Recent Labs  Lab 05/16/20 0601  AST 23  ALT 15  ALKPHOS 74  BILITOT 1.0  PROT 5.9*  ALBUMIN 2.3*   No results for input(s): LIPASE, AMYLASE in the last 168 hours. No results for input(s): AMMONIA in the last 168 hours.  CBC: Recent Labs  Lab 04/30/2020 1714 05/16/20 0601  WBC 7.0 2.9*  NEUTROABS 5.9 2.5  HGB 13.2 11.8*  HCT 36.8* 32.7*   MCV 79.7* 79.6*  PLT 219 168    Cardiac Enzymes: No results for input(s): CKTOTAL, TROPONINI in the last 168 hours.  BNP: Invalid input(s): POCBNP  CBG: Recent Labs  Lab 05/16/20 0502 05/16/20 0733 05/16/20 1138  GLUCAP 488* 510* 390*    Microbiology: Recent Results (from the past 720 hour(s))  Respiratory Panel by RT PCR (Flu A&B, Covid) - Nasopharyngeal Swab     Status: Abnormal   Collection Time: 05/02/2020  5:14 PM   Specimen: Nasopharyngeal Swab  Result Value Ref Range Status   SARS Coronavirus 2 by RT PCR POSITIVE (A) NEGATIVE Final    Comment: RESULT CALLED TO, READ BACK BY AND VERIFIED WITH: LORRIE LEMONS ON 05/10/2020 AT 1851 QSD (NOTE) SARS-CoV-2 target nucleic acids are DETECTED.  SARS-CoV-2 RNA is generally detectable in upper respiratory specimens  during the acute phase of infection. Positive results are indicative of the presence of the identified virus, but do not rule out bacterial infection or co-infection with other pathogens not detected by the test. Clinical correlation with patient history and other diagnostic information is necessary to determine patient infection status. The expected result is Negative.  Fact Sheet for Patients:  05/17/20  Fact Sheet for Healthcare Providers: https://www.moore.com/  This test is not yet approved or cleared by the https://www.young.biz/ FDA and  has been authorized for detection and/or  diagnosis of SARS-CoV-2 by FDA under an Emergency Use Authorization (EUA).  This EUA will remain in effect (meaning this test can b e used) for the duration of  the COVID-19 declaration under Section 564(b)(1) of the Act, 21 U.S.C. section 360bbb-3(b)(1), unless the authorization is terminated or revoked sooner.      Influenza A by PCR NEGATIVE NEGATIVE Final   Influenza B by PCR NEGATIVE NEGATIVE Final    Comment: (NOTE) The Xpert Xpress SARS-CoV-2/FLU/RSV assay is intended as an  aid in  the diagnosis of influenza from Nasopharyngeal swab specimens and  should not be used as a sole basis for treatment. Nasal washings and  aspirates are unacceptable for Xpert Xpress SARS-CoV-2/FLU/RSV  testing.  Fact Sheet for Patients: https://www.moore.com/https://www.fda.gov/media/142436/download  Fact Sheet for Healthcare Providers: https://www.young.biz/https://www.fda.gov/media/142435/download  This test is not yet approved or cleared by the [place] FDA and  has been authorized for detection and/or diagnosis of SARS-CoV-2 by  FDA under an Emergency Use Authorization (EUA). This EUA will remain  in effect (meaning this test can be used) for the duration of the  Covid-19 declaration under Section 564(b)(1) of the Act, 21  U.S.C. section 360bbb-3(b)(1), unless the authorization is  terminated or revoked. Performed at [facility], [address] [place], [place] [address]   Blood culture (routine x 2)     Status: None (Preliminary result)   Collection Time: 05/01/2020  5:14 PM   Specimen: BLOOD  Result Value Ref Range Status   Specimen Description BLOOD LEFT ARM  Final   Special Requests   Final    BOTTLES DRAWN AEROBIC AND ANAEROBIC Blood Culture adequate volume   Culture   Final    NO GROWTH < 12 HOURS Performed at [facility], [address] [place], [place] [address]    Report Status PENDING  Incomplete  Blood culture (routine x 2)     Status: None (Preliminary result)   Collection Time: 04/22/2020  5:14 PM   Specimen: BLOOD  Result Value Ref Range Status   Specimen Description BLOOD RIGHT ARM  Final   Special Requests   Final    BOTTLES DRAWN AEROBIC AND ANAEROBIC Blood Culture adequate volume   Culture   Final    NO GROWTH < 12 HOURS Performed at [facility], [address] [place], [place] [address]    Report Status PENDING  Incomplete     Coagulation Studies: No results for input(s): LABPROT, INR in the last 72 hours.  Urinalysis: No results for  input(s): COLORURINE, LABSPEC, PHURINE, GLUCOSEU, HGBUR, BILIRUBINUR, KETONESUR, PROTEINUR, UROBILINOGEN, NITRITE, LEUKOCYTESUR in the last 72 hours.  Invalid input(s): APPERANCEUR      Imaging: DG Chest Port 1 View  Result Date: 05/10/2020 CLINICAL DATA:  Shortness of breath EXAM: PORTABLE CHEST 1 VIEW COMPARISON:  None. FINDINGS: Cardiac shadow is within normal limits. The lungs are well aerated bilaterally. Diffuse increased density is noted in the left base consistent with early infiltrate. No bony abnormality is noted. IMPRESSION: Early infiltrate in the left base. Electronically Signed   By: Alcide CleverMark  Lukens M.D.   On: 05/11/2020 17:39      Assessment & Plan: Pt is a 59 y.o. Caucasian  male with , was admitted on 04/20/2020 with Acute respiratory failure with hypoxia (HCC) [J96.01] Coronavirus infection [B34.2] Generalized weakness [R53.1] Cough in adult patient [R05] Community acquired pneumonia of left lung, unspecified part of lung [J18.9] Acute hypoxemic respiratory failure due to COVID-19 (HCC) [U07.1, J96.01]   #  Acute kidney injury #Diabetes type 2, poorly controlled #Multifocal pneumonia secondary to COVID-19 #Hyponatremia #Significant history of smoking  Plan: We will obtain a renal ultrasound Obtain screening serologies Renal disease could be chronic, related to poorly controlled diabetes versus acute Further recommendations once results of the lab tests and imaging are available      LOS: 1 Tavyn Kurka 9/27/20212:05 PM    Note: This note was prepared with Dragon dictation. Any transcription errors are unintentional

## 2020-05-16 NOTE — Progress Notes (Signed)
[facility]  - [place] at [facility]   PATIENT NAME: Jackson Stark    MR#:  [id number]  DATE OF BIRTH:  [birth year]  SUBJECTIVE:    REVIEW OF SYSTEMS:   ROS Tolerating Diet: Tolerating PT:   DRUG ALLERGIES:  Not on File  VITALS:  Blood pressure (!) 172/84, pulse 78, temperature 97.8 F (36.6 C), temperature source Oral, resp. rate 16, height 5\' 10"  (1.778 m), weight 77.1 kg, SpO2 93 %.  PHYSICAL EXAMINATION:   Physical Exam  GENERAL:  59 y.o.-year-old patient lying in the bed with no acute distress.  EYES: Pupils equal, round, reactive to light and accommodation. No scleral icterus.   HEENT: Head atraumatic, normocephalic. Oropharynx and nasopharynx clear.  NECK:  Supple, no jugular venous distention. No thyroid enlargement, no tenderness.  LUNGS: Normal breath sounds bilaterally, no wheezing, rales, rhonchi. No use of accessory muscles of respiration.  CARDIOVASCULAR: S1, S2 normal. No murmurs, rubs, or gallops.  ABDOMEN: Soft, nontender, nondistended. Bowel sounds present. No organomegaly or mass.  EXTREMITIES: No cyanosis, clubbing or edema b/l.    NEUROLOGIC: Cranial nerves II through XII are intact. No focal Motor or sensory deficits b/l.   PSYCHIATRIC:  patient is alert and oriented x 3.  SKIN: No obvious rash, lesion, or ulcer.   LABORATORY PANEL:  CBC Recent Labs  Lab 05/16/20 0601  WBC 2.9*  HGB 11.8*  HCT 32.7*  PLT 168    Chemistries  Recent Labs  Lab 05/16/20 0601  NA 128*  K 3.6  CL 95*  CO2 15*  GLUCOSE 570*  BUN 93*  CREATININE 5.59*  CALCIUM 7.8*  AST 23  ALT 15  ALKPHOS 74  BILITOT 1.0   Cardiac Enzymes No results for input(s): TROPONINI in the last 168 hours. RADIOLOGY:  DG Chest Port 1 View  Result Date: 05/18/2020 CLINICAL DATA:  Shortness of breath EXAM: PORTABLE CHEST 1 VIEW COMPARISON:  None. FINDINGS: Cardiac shadow is within normal limits. The lungs are well aerated bilaterally. Diffuse increased density  is noted in the left base consistent with early infiltrate. No bony abnormality is noted. IMPRESSION: Early infiltrate in the left base. Electronically Signed   By: 05/17/2020 M.D.   On: 05/18/20 17:39   ASSESSMENT AND PLAN:   Jackson Stark  is a 59 y.o. Caucasian male with a known history of COPD, hypertension and type 2 diabetes mellitus, who presented to the emergency room with acute onset of worsening dyspnea with associated dry cough with generalized weakness over the last couple weeks.  He has had recurrent falls during this time.  H  He admits to nasal congestion without sore throat or earache.  He denied any loss of taste or smell.  Denies any fever or chills.  No nausea or vomiting or diarrhea.  1. Acute hypoxemic respiratory failure secondary to COVID-19. -O2 protocol will be followed to keep O2 saturation above 93. -- Currently on 7 L high flow nasal cannula oxygen. No respiratory distress -- continue bronchodilators -- incentive spirometer/flutter valve  2. Multifocal pneumonia secondary to COVID-19. -on scheduled Mucinex and as needed Tussionex. -O2 protocol will be followed. -- on IV Remdesivir and IV steroid therapy with IV Solu-Medrol with elevated inflammatory markers. -CRP 8.7 -D dimer 1079 -cont vitamin D3, vitamin C, zinc sulfate, p.o. Pepcid and aspirin. -- Patient currently not in respiratory distress.  3.  Acute kidney injury with acidosis in the setting of type II diabetes uncontrolled and hypertension - could be acute  superimposed on chronic kidney disease. --patient has been noncompliant to medication and follow-up with primary MD more than one year -- continue IV fluids -- nephrology consulted -- check renal ultrasound  4. Hypokalemia, hyponatremia and hypochloremia.. - likely secondary to volume depletion with associated anorexia and uncontrolled sugars --K 3.6 -cont IVF for now  5. Uncontrolled type II diabetes secondary to medical noncompliance  and currently being on steroids -hemoglobin A1c 13.8 -insulin Lantus 10 units BID and sliding scale --diabetes coordinator input appreciated  6.  DVT prophylaxis. -Subcutaneous Lovenox.  7.uncontrolled HTN-new -started on hydralazine  CODE STATUS: Full code  Status is: Inpatient  Remains inpatient appropriate because:Ongoing diagnostic testing needed not appropriate for outpatient work up, Unsafe d/c plan, IV treatments appropriate due to intensity of illness or inability to take PO and Inpatient level of care appropriate due to severity of illness   Dispo: The patient is from: Home  Anticipated d/c is to: Home  Anticipated d/c date is: > 3 days  Patient currently is not medically stable       TOTAL TIME TAKING CARE OF THIS PATIENT: 25* minutes.  >50% time spent on counselling and coordination of care  Note: This dictation was prepared with Dragon dictation along with smaller phrase technology. Any transcriptional errors that result from this process are unintentional.  Enedina Finner M.D    [facility]   CC: Primary care physician; Patient, No Pcp PerPatient ID: AADIN Jackson Stark, male   DOB: 11-16-1960, 59 y.o.   MRN: [id number]

## 2020-05-16 NOTE — Progress Notes (Signed)
Initial Nutrition Assessment  DOCUMENTATION CODES:   Non-severe (moderate) malnutrition in context of chronic illness  INTERVENTION:  Plan is to liberalize diet to carbohydrate modified.  Provide Nepro Shake po TID, each supplement provides 425 kcal and 19 grams protein. Patient prefers mixed berry or butter pecan.  Provide MVI po daily.  Will provide diet education for carbohydrate-modified diet prior to discharge as appropriate. At this time first priority is in addressing malnutrition and poor PO intake, especially in setting of catabolic nature of [id number]-64.   NUTRITION DIAGNOSIS:   Moderate Malnutrition related to chronic illness (COPD) as evidenced by moderate fat depletion, moderate muscle depletion, severe muscle depletion.  GOAL:   Patient will meet greater than or equal to 90% of their needs  MONITOR:   PO intake, Supplement acceptance, Labs, Weight trends, I & O's  REASON FOR ASSESSMENT:   Malnutrition Screening Tool, Consult Diet education  ASSESSMENT:   59 year old male with PMHx of COPD, DM, HTN admitted with COVID-19, AKI with acidosis.   Met with patient at bedside. He reports his appetite has been decreased for several months now but is worsened with COVID-19. He reports he would try to eat 2-3 meals per day but was eating very small amounts at meals. He reports possibly 1/2 sandwich or only bites. Unable to get enough information to accurately estimate kcal intake PTA over the past several months. Lately with COVID-19 he reports he was only taking some sips of water PTA. He reports his appetite is still decreased. He had finished about 50% of his lunch at time of RD assessment, which is an improvement. He reports he only had about 25% of breakfast. Patient is amenable to drinking oral nutrition supplements to help meet calorie/protein needs. Patient not appropriate for nutrition education at this time in setting of malnutrition and poor PO intake as first priority  is to address this.  Patient reports his UBW was 180 lbs. He reports he has been losing weight over the past 4-5 months at least but is unsure exactly when weight loss started or how much he has lost. RD obtained bed scale weight today of 71.7 kg (158 lbs). No weight history in chart to trend.  Medications reviewed and include: vitamin C 500 mg daily, famotidine, Novolog 0-15 units TID, Novolog 0-5 units QHS, Lantus 10 units BID, Solu-Medrol 1 mg/kg Q12hrs IV, zinc sulfate 220 mg daily, remdesivir.  Labs reviewed: CBG 390-510, Sodium 128, Chloride 95, CO2 15, BUN 93, Creatinine 5.59, HgbA1c 13.8.  Discussed with MD via secure chat. Okay to liberalize diet to carbohydrate modified.  NUTRITION - FOCUSED PHYSICAL EXAM:    Most Recent Value  Orbital Region Moderate depletion  Upper Arm Region Severe depletion  Thoracic and Lumbar Region Mild depletion  Buccal Region Moderate depletion  Temple Region Moderate depletion  Clavicle Bone Region Moderate depletion  Clavicle and Acromion Bone Region Moderate depletion  Scapular Bone Region Moderate depletion  Dorsal Hand Mild depletion  Patellar Region Severe depletion  Anterior Thigh Region Severe depletion  Posterior Calf Region Severe depletion  Edema (RD Assessment) None  Hair Reviewed  Eyes Reviewed  Mouth Reviewed  Skin Reviewed  Nails Reviewed     Diet Order:   Diet Order            Diet renal/carb modified with fluid restriction Diet-HS Snack? Nothing; Fluid restriction: 1200 mL Fluid; Room service appropriate? Yes; Fluid consistency: Thin  Diet effective now  EDUCATION NEEDS:   Not appropriate for education at this time  Skin:  Skin Assessment: Reviewed RN Assessment  Last BM:  04/30/2020 per chart  Height:   Ht Readings from Last 1 Encounters:  05/01/2020 $RemoveB'5\' 10"'TInjSRQY$  (1.778 m)   Weight:   Wt Readings from Last 1 Encounters:  05/16/20 71.7 kg   Ideal Body Weight:  75.5 kg  BMI:  Body mass index is  22.68 kg/m.  Estimated Nutritional Needs:   Kcal:  2000-2200  Protein:  100-110 grams  Fluid:  >/= 2 L/day  Jacklynn Barnacle, MS, RD, LDN Pager number available on Amion

## 2020-05-16 NOTE — ED Notes (Signed)
Per patient request, primary contact for updates is his girlfriend Chief Technology Officer. Her number is [phone].

## 2020-05-16 NOTE — Progress Notes (Signed)
O2 saturation dropped into the 70's  after shift change on HFNC  at 8L, increased to 15L HFNC, still in low 80's, added non rebreather  o2 sat increased to 96%  , oncoming nurse aware.

## 2020-05-17 DIAGNOSIS — E1365 Other specified diabetes mellitus with hyperglycemia: Secondary | ICD-10-CM

## 2020-05-17 DIAGNOSIS — N179 Acute kidney failure, unspecified: Secondary | ICD-10-CM | POA: Diagnosis not present

## 2020-05-17 DIAGNOSIS — IMO0002 Reserved for concepts with insufficient information to code with codable children: Secondary | ICD-10-CM

## 2020-05-17 DIAGNOSIS — E440 Moderate protein-calorie malnutrition: Secondary | ICD-10-CM | POA: Diagnosis not present

## 2020-05-17 DIAGNOSIS — J9601 Acute respiratory failure with hypoxia: Secondary | ICD-10-CM

## 2020-05-17 DIAGNOSIS — R531 Weakness: Secondary | ICD-10-CM

## 2020-05-17 LAB — C-REACTIVE PROTEIN: CRP: 6.2 mg/dL — ABNORMAL HIGH (ref <1.0)

## 2020-05-17 LAB — COMPREHENSIVE METABOLIC PANEL
ALT: 14 U/L (ref 0–44)
AST: 26 U/L (ref 15–41)
Albumin: 2.2 g/dL — ABNORMAL LOW (ref 3.5–5.0)
Alkaline Phosphatase: 69 U/L (ref 38–126)
Anion gap: 17 — ABNORMAL HIGH (ref 5–15)
BUN: 94 mg/dL — ABNORMAL HIGH (ref 6–20)
CO2: 18 mmol/L — ABNORMAL LOW (ref 22–32)
Calcium: 8.1 mg/dL — ABNORMAL LOW (ref 8.9–10.3)
Chloride: 96 mmol/L — ABNORMAL LOW (ref 98–111)
Creatinine, Ser: 4.91 mg/dL — ABNORMAL HIGH (ref 0.61–1.24)
GFR calc Af Amer: 14 mL/min — ABNORMAL LOW (ref >60)
GFR calc non Af Amer: 12 mL/min — ABNORMAL LOW (ref >60)
Glucose, Bld: 227 mg/dL — ABNORMAL HIGH (ref 70–99)
Potassium: 3.2 mmol/L — ABNORMAL LOW (ref 3.5–5.1)
Sodium: 131 mmol/L — ABNORMAL LOW (ref 135–145)
Total Bilirubin: 0.9 mg/dL (ref 0.3–1.2)
Total Protein: 5.7 g/dL — ABNORMAL LOW (ref 6.5–8.1)

## 2020-05-17 LAB — CBC WITH DIFFERENTIAL/PLATELET
Abs Immature Granulocytes: 0.02 10*3/uL (ref 0.00–0.07)
Basophils Absolute: 0 10*3/uL (ref 0.0–0.1)
Basophils Relative: 0 %
Eosinophils Absolute: 0 10*3/uL (ref 0.0–0.5)
Eosinophils Relative: 0 %
HCT: 31.2 % — ABNORMAL LOW (ref 39.0–52.0)
Hemoglobin: 11.6 g/dL — ABNORMAL LOW (ref 13.0–17.0)
Immature Granulocytes: 0 %
Lymphocytes Relative: 10 %
Lymphs Abs: 0.5 10*3/uL — ABNORMAL LOW (ref 0.7–4.0)
MCH: 28.9 pg (ref 26.0–34.0)
MCHC: 37.2 g/dL — ABNORMAL HIGH (ref 30.0–36.0)
MCV: 77.6 fL — ABNORMAL LOW (ref 80.0–100.0)
Monocytes Absolute: 0.2 10*3/uL (ref 0.1–1.0)
Monocytes Relative: 4 %
Neutro Abs: 4.4 10*3/uL (ref 1.7–7.7)
Neutrophils Relative %: 86 %
Platelets: 190 10*3/uL (ref 150–400)
RBC: 4.02 MIL/uL — ABNORMAL LOW (ref 4.22–5.81)
RDW: 12.6 % (ref 11.5–15.5)
Smear Review: NORMAL
WBC: 5.1 10*3/uL (ref 4.0–10.5)
nRBC: 0 % (ref 0.0–0.2)

## 2020-05-17 LAB — GLUCOSE, CAPILLARY
Glucose-Capillary: 229 mg/dL — ABNORMAL HIGH (ref 70–99)
Glucose-Capillary: 378 mg/dL — ABNORMAL HIGH (ref 70–99)
Glucose-Capillary: 377 mg/dL — ABNORMAL HIGH (ref 70–99)
Glucose-Capillary: 457 mg/dL — ABNORMAL HIGH (ref 70–99)

## 2020-05-17 LAB — FIBRIN DERIVATIVES D-DIMER (ARMC ONLY): Fibrin derivatives D-dimer (ARMC): 905.82 ng/mL (FEU) — ABNORMAL HIGH (ref 0.00–499.00)

## 2020-05-17 MED ORDER — STERILE WATER FOR INJECTION IV SOLN
INTRAVENOUS | Status: DC
  Filled 2020-05-17 (×4): qty 850
  Filled 2020-05-17: qty 150
  Filled 2020-05-17 (×2): qty 850

## 2020-05-17 MED ORDER — INSULIN GLARGINE 100 UNIT/ML ~~LOC~~ SOLN
15.0000 [IU] | Freq: Two times a day (BID) | SUBCUTANEOUS | Status: DC
  Administered 2020-05-17 (×2): 15 [IU] via SUBCUTANEOUS
  Filled 2020-05-17 (×3): qty 0.15

## 2020-05-17 MED ORDER — INSULIN ASPART 100 UNIT/ML ~~LOC~~ SOLN
10.0000 [IU] | Freq: Once | SUBCUTANEOUS | Status: AC
  Administered 2020-05-17: 22:00:00 10 [IU] via SUBCUTANEOUS

## 2020-05-17 MED ORDER — POTASSIUM CHLORIDE CRYS ER 20 MEQ PO TBCR
40.0000 meq | EXTENDED_RELEASE_TABLET | Freq: Once | ORAL | Status: AC
  Administered 2020-05-17: 40 meq via ORAL
  Filled 2020-05-17: qty 2

## 2020-05-17 NOTE — Progress Notes (Signed)
Inpatient Diabetes Program Recommendations  AACE/ADA: New Consensus Statement on Inpatient Glycemic Control (2015)  Target Ranges:  Prepandial:   less than 140 mg/dL      Peak postprandial:   less than 180 mg/dL (1-2 hours)      Critically ill patients:  140 - 180 mg/dL   Lab Results  Component Value Date   GLUCAP 378 (H) 05/17/2020   HGBA1C 13.8 (H) 05/16/2020    Review of Glycemic Control Results for Jackson Stark, Jackson Stark (MRN [id number]) as of 05/17/2020 13:57  Ref. Range 05/16/2020 11:38 05/16/2020 16:09 05/16/2020 21:43 05/17/2020 08:01 05/17/2020 12:04  Glucose-Capillary Latest Ref Range: 70 - 99 mg/dL 144 (H) 818 (H) 563 (H) 229 (H) 378 (H)  Diabetes history: None noted Outpatient Diabetes medications:  None Current orders for Inpatient glycemic control:  Lantus 15 units bid, Novolog moderate tid with meals and HS- Solumedrol 77 mg bid Inpatient Diabetes Program Recommendations:    Consider adding Novolog meal coverage 4 units tid with meals (hold if patient eats less than 50% or NPO).   Thanks,  Beryl Meager, RN, BC-ADM Inpatient Diabetes Coordinator Pager [phone] (8a-5p)

## 2020-05-17 NOTE — Progress Notes (Signed)
[facility]  ROUNDING NOTE   Subjective:   Patient found sitting up in bed, in no acute distress. Supplemental O2 via high flow nasal canula in place, with SpO2 levels above 90.Patient denies worsening SOB, nausea or vomiting at this time.  Objective:  Vital signs in last 24 hours:  Temp:  [97.3 F (36.3 C)-98.1 F (36.7 C)] 97.7 F (36.5 C) (09/28 0751) Pulse Rate:  [68-88] 70 (09/28 0751) Resp:  [16-21] 17 (09/28 0751) BP: (159-173)/(68-85) 163/74 (09/28 0751) SpO2:  [76 %-100 %] 96 % (09/28 0751) Weight:  [71.7 kg] 71.7 kg (09/27 1539)  Weight change: -5.411 kg Filed Weights   05/13/2020 1707 05/16/20 1539  Weight: 77.1 kg 71.7 kg    Intake/Output: I/O last 3 completed shifts: In: 3925.5 [P.O.:717; I.V.:858.5; IV Piggyback:2350] Out: 1425 [Urine:1425]   Intake/Output this shift:  Total I/O In: -  Out: 300 [Urine:300]  Physical Exam: General: In no acute distress  Head:  Moist oral mucosal membranes,High flow nasal canula in place  Eyes: Anicteric, PERRL  Neck: Supple, trachea midline  Lungs:  Clear to auscultation  Heart: S1S2 + Regular rate and rhythm  Abdomen:  Soft, nontender, non distended  Extremities: No  peripheral edema.  Neurologic: Alert,oriented  Skin: No acute rashes or  lesions       Basic Metabolic Panel: Recent Labs  Lab 05/01/2020 1714 05/16/20 0601 05/17/20 0541  NA 127* 128* 131*  K 3.1* 3.6 3.2*  CL 92* 95* 96*  CO2 17* 15* 18*  GLUCOSE 321* 570* 227*  BUN 86* 93* 94*  CREATININE 5.78* 5.59* 4.91*  CALCIUM 8.1* 7.8* 8.1*    Liver Function Tests: Recent Labs  Lab 05/12/2020 1714 05/16/20 0601 05/17/20 0541  AST 28 23 26   ALT 16 15 14   ALKPHOS 84 74 69  BILITOT 1.1 1.0 0.9  PROT 6.8 5.9* 5.7*  ALBUMIN 2.6* 2.3* 2.2*   No results for input(s): LIPASE, AMYLASE in the last 168 hours. No results for input(s): AMMONIA in the last 168 hours.  CBC: Recent Labs  Lab 05/19/2020 1714 05/16/20 0601 05/17/20 0541   WBC 7.0 2.9* 5.1  NEUTROABS 5.9 2.5 4.4  HGB 13.2 11.8* 11.6*  HCT 36.8* 32.7* 31.2*  MCV 79.7* 79.6* 77.6*  PLT 219 168 190    Cardiac Enzymes: No results for input(s): CKTOTAL, CKMB, CKMBINDEX, TROPONINI in the last 168 hours.  BNP: Invalid input(s): POCBNP  CBG: Recent Labs  Lab 05/16/20 0733 05/16/20 1138 05/16/20 1609 05/16/20 2143 05/17/20 0801  GLUCAP 510* 390* 281* 238* 229*    Microbiology: Results for orders placed or performed during the hospital encounter of 05/18/2020  Respiratory Panel by RT PCR (Flu A&B, Covid) - Nasopharyngeal Swab     Status: Abnormal   Collection Time: 05/18/2020  5:14 PM   Specimen: Nasopharyngeal Swab  Result Value Ref Range Status   SARS Coronavirus 2 by RT PCR POSITIVE (A) NEGATIVE Final    Comment: RESULT CALLED TO, READ BACK BY AND VERIFIED WITH: Jackson Stark ON 04/26/2020 AT 1851 QSD (NOTE) SARS-CoV-2 target nucleic acids are DETECTED.  SARS-CoV-2 RNA is generally detectable in upper respiratory specimens  during the acute phase of infection. Positive results are indicative of the presence of the identified virus, but do not rule out bacterial infection or co-infection with other pathogens not detected by the test. Clinical correlation with patient history and other diagnostic information is necessary to determine patient infection status. The expected result is Negative.  Fact Sheet  for Patients:  https://www.moore.com/  Fact Sheet for Healthcare Providers: https://www.young.biz/  This test is not yet approved or cleared by the [place] FDA and  has been authorized for detection and/or diagnosis of SARS-CoV-2 by FDA under an Emergency Use Authorization (EUA).  This EUA will remain in effect (meaning this test can b e used) for the duration of  the COVID-19 declaration under Section 564(b)(1) of the Act, 21 U.S.C. section 360bbb-3(b)(1), unless the authorization is terminated or  revoked sooner.      Influenza A by PCR NEGATIVE NEGATIVE Final   Influenza B by PCR NEGATIVE NEGATIVE Final    Comment: (NOTE) The Xpert Xpress SARS-CoV-2/FLU/RSV assay is intended as an aid in  the diagnosis of influenza from Nasopharyngeal swab specimens and  should not be used as a sole basis for treatment. Nasal washings and  aspirates are unacceptable for Xpert Xpress SARS-CoV-2/FLU/RSV  testing.  Fact Sheet for Patients: https://www.moore.com/  Fact Sheet for Healthcare Providers: https://www.young.biz/  This test is not yet approved or cleared by the [place] FDA and  has been authorized for detection and/or diagnosis of SARS-CoV-2 by  FDA under an Emergency Use Authorization (EUA). This EUA will remain  in effect (meaning this test can be used) for the duration of the  Covid-19 declaration under Section 564(b)(1) of the Act, 21  U.S.C. section 360bbb-3(b)(1), unless the authorization is  terminated or revoked. Performed at [facility], [address] [place], [place] [address]   Blood culture (routine x 2)     Status: None (Preliminary result)   Collection Time: 05/09/2020  5:14 PM   Specimen: BLOOD  Result Value Ref Range Status   Specimen Description BLOOD LEFT ARM  Final   Special Requests   Final    BOTTLES DRAWN AEROBIC AND ANAEROBIC Blood Culture adequate volume   Culture   Final    NO GROWTH 2 DAYS Performed at [facility], [address], [place], [place] [address]    Report Status PENDING  Incomplete  Blood culture (routine x 2)     Status: None (Preliminary result)   Collection Time: 05/08/2020  5:14 PM   Specimen: BLOOD  Result Value Ref Range Status   Specimen Description BLOOD RIGHT ARM  Final   Special Requests   Final    BOTTLES DRAWN AEROBIC AND ANAEROBIC Blood Culture adequate volume   Culture   Final    NO GROWTH 2 DAYS Performed at [facility], [address]  [place], [place] [address]    Report Status PENDING  Incomplete    Coagulation Studies: No results for input(s): LABPROT, INR in the last 72 hours.  Urinalysis: No results for input(s): COLORURINE, LABSPEC, PHURINE, GLUCOSEU, HGBUR, BILIRUBINUR, KETONESUR, PROTEINUR, UROBILINOGEN, NITRITE, LEUKOCYTESUR in the last 72 hours.  Invalid input(s): APPERANCEUR    Imaging: US RENAL  Result Date: 05/16/2020 CLINICAL DATA:  Acute kidney injury EXAM: RENAL / URINARY TRACT ULTRASOUND COMPLETE COMPARISON:  None. FINDINGS: Right Kidney: Renal measurements: 11.6 x 6.1 x 4.2 cm = volume: 154 mL. Echogenicity within normal limits. No mass or hydronephrosis visualized. Left Kidney: Renal measurements: 10.1 x 6.1 x 5.1 cm = volume: 162 mL. Echogenicity within normal limits. No mass or hydronephrosis visualized. Bladder: Appears normal for degree of bladder distention. Other: None. IMPRESSION: Negative renal ultrasound. Electronically Signed   By: Marlan Palau M.D.   On: 05/16/2020 17:06   DG Chest Port 1 View  Result Date: 04/22/2020 CLINICAL DATA:  Shortness of breath EXAM: PORTABLE CHEST 1 VIEW COMPARISON:  None. FINDINGS: Cardiac shadow is within normal limits. The lungs are well aerated bilaterally. Diffuse increased density is noted in the left base consistent with early infiltrate. No bony abnormality is noted. IMPRESSION: Early infiltrate in the left base. Electronically Signed   By: Alcide Clever M.D.   On: 05/04/2020 17:39     Medications:   . remdesivir 100 mg in NS 100 mL 100 mg (05/17/20 1009)  .  sodium bicarbonate (isotonic) infusion in sterile water     . vitamin C  500 mg Oral Daily  . aspirin EC  81 mg Oral Daily  . enoxaparin (LOVENOX) injection  30 mg Subcutaneous Q24H  . famotidine  10 mg Oral Daily  . feeding supplement (NEPRO CARB STEADY)  237 mL Oral TID BM  . guaiFENesin  600 mg Oral BID  . hydrALAZINE  25 mg Oral TID  . influenza vac split quadrivalent PF  0.5 mL Intramuscular  Tomorrow-1000  . insulin aspart  0-15 Units Subcutaneous TID WC  . insulin aspart  0-5 Units Subcutaneous QHS  . insulin glargine  15 Units Subcutaneous BID  . methylPREDNISolone (SOLU-MEDROL) injection  1 mg/kg Intravenous Q12H   Followed by  . [START ON 05/19/2020] predniSONE  50 mg Oral Daily  . multivitamin with minerals  1 tablet Oral Daily  . pneumococcal 23 valent vaccine  0.5 mL Intramuscular Tomorrow-1000  . zinc sulfate  220 mg Oral Daily   acetaminophen, chlorpheniramine-HYDROcodone, guaiFENesin-dextromethorphan, magnesium hydroxide, ondansetron **OR** ondansetron (ZOFRAN) IV, traZODone  Assessment/ Plan:  Mr. Jackson Stark is a 59 y.o.  male presented from home for shortness of breath and increasing weakness.  Hypoxic on room air as per EMS.  Diagnosed with Covid pneumonia.  He has recurrent falls, nasal congestion.  Lab results showed acidosis, hyponatremia, hypokalemia, increased creatinine, low albumin.h/o diabetes for 20 years, was not compliant with medications for the past one year.  #Acute kidney injury Creatinine today is 4.91, trending down Renal US Normal Will continue following up labs Continue Sodium Bicarbonate infusion 100 ml/hr  #Diabetes type 2, poorly controlled Last HbA1C: 13.8 on 05/16/20 Currently on sliding scale and long acting insulins, blood sugar readings better now  #Multifocal pneumonia secondary to COVID-19 Patient is on Remdesivir and steroids Required non breather during this admission, weaned down to high flow O2 this morning  #Hyponatremia Na+ today is 131, improving Will continue monitoring  #Hypokalemia K+  3.2 today Started on Potassium supplements today    LOS: 2 Jackson Stark 9/28/202111:27 AM

## 2020-05-17 NOTE — Consult Note (Addendum)
PHARMACY CONSULT NOTE - FOLLOW UP  Pharmacy Consult for Electrolyte Monitoring and Replacement   Recent Labs: Potassium (mmol/L)  Date Value  05/17/2020 3.2 (L)   Calcium (mg/dL)  Date Value  42/87/6811 8.1 (L)   Albumin (g/dL)  Date Value  57/26/2035 2.2 (L)   Sodium (mmol/L)  Date Value  05/17/2020 131 (L)     Assessment: NathanTidwellis a58 y.o.malewith a known history of COPD, hypertension and type 2 diabetes mellitus, who presented to the emergency room with acute onset of worsening dyspnea/COVID PNA and hyperglycemia.  BG currently 229 on solumedrol, SSI, and glargine 15 units bid.  Pharmacy has been consulted to monitor/replenish K.  Goal of Therapy:  K ~ 4  Plan:  Will give KCl po x 1 and reassess with am labs.  Jackson Stark ,PharmD Clinical Pharmacist 05/17/2020 10:24 AM

## 2020-05-17 NOTE — Progress Notes (Signed)
Patient ID: Jackson Stark, male   DOB: December 18, 1960, 59 y.o.   MRN: [id number]  Spoke with patient's friend Shella Maxim and updated her

## 2020-05-17 NOTE — Progress Notes (Addendum)
[facility]  - [place] at [facility]   PATIENT NAME: Jackson Stark    MR#:  [id number]  DATE OF BIRTH:  02-04-1961  SUBJECTIVE:  patient feels a lot better. His oxygen requirement went up to not rebreather with 15 L high flow. He is not in any respiratory distress. I turned off the nonrebreather this morning. Sats remain 97% on high flow. Will continue the same. Encouraged to use flutter valve and incentive spirometer  he is eating better. He says he slept better.  REVIEW OF SYSTEMS:   Review of Systems  Constitutional: Positive for malaise/fatigue. Negative for chills, fever and weight loss.  HENT: Negative for ear discharge, ear pain and nosebleeds.   Eyes: Negative for blurred vision, pain and discharge.  Respiratory: Positive for shortness of breath. Negative for sputum production, wheezing and stridor.   Cardiovascular: Negative for chest pain, palpitations, orthopnea and PND.  Gastrointestinal: Negative for abdominal pain, diarrhea, nausea and vomiting.  Genitourinary: Negative for frequency and urgency.  Musculoskeletal: Negative for back pain and joint pain.  Neurological: Positive for weakness. Negative for sensory change, speech change and focal weakness.  Psychiatric/Behavioral: Negative for depression and hallucinations. The patient is not nervous/anxious.    Tolerating Diet:yes Tolerating PT: pending  DRUG ALLERGIES:  Not on File  VITALS:  Blood pressure (!) 163/74, pulse 70, temperature 97.7 F (36.5 C), resp. rate 17, height 5\' 10"  (1.778 m), weight 71.7 kg, SpO2 96 %.  PHYSICAL EXAMINATION:   Physical Exam  GENERAL:  59 y.o.-year-old patient lying in the bed with no acute distress. disheveled , malnourished HEENT: Head atraumatic, normocephalic. Oropharynx and nasopharynx clear. HFNC+ LUNGS: Distant breath sounds bilaterally, no wheezing, rales, rhonchi. No use of accessory muscles of respiration.  CARDIOVASCULAR: S1, S2 normal. No  murmurs, rubs, or gallops.  ABDOMEN: Soft, nontender, mildly distended. Bowel sounds present. No organomegaly or mass.  EXTREMITIES: No cyanosis, clubbing or edema b/l.    NEUROLOGIC: Cranial nerves II through XII are intact. No focal Motor or sensory deficits b/l.  weak PSYCHIATRIC:  patient is alert and oriented x 3.  SKIN: No obvious rash, lesion, or ulcer. Per RN  LABORATORY PANEL:  CBC Recent Labs  Lab 05/17/20 0541  WBC 5.1  HGB 11.6*  HCT 31.2*  PLT 190    Chemistries  Recent Labs  Lab 05/17/20 0541  NA 131*  K 3.2*  CL 96*  CO2 18*  GLUCOSE 227*  BUN 94*  CREATININE 4.91*  CALCIUM 8.1*  AST 26  ALT 14  ALKPHOS 69  BILITOT 0.9   Cardiac Enzymes No results for input(s): TROPONINI in the last 168 hours. RADIOLOGY:  05/19/20 RENAL  Result Date: 05/16/2020 CLINICAL DATA:  Acute kidney injury EXAM: RENAL / URINARY TRACT ULTRASOUND COMPLETE COMPARISON:  None. FINDINGS: Right Kidney: Renal measurements: 11.6 x 6.1 x 4.2 cm = volume: 154 mL. Echogenicity within normal limits. No mass or hydronephrosis visualized. Left Kidney: Renal measurements: 10.1 x 6.1 x 5.1 cm = volume: 162 mL. Echogenicity within normal limits. No mass or hydronephrosis visualized. Bladder: Appears normal for degree of bladder distention. Other: None. IMPRESSION: Negative renal ultrasound. Electronically Signed   By: 05/18/2020 M.D.   On: 05/16/2020 17:06   DG Chest Port 1 View  Result Date: 05/02/2020 CLINICAL DATA:  Shortness of breath EXAM: PORTABLE CHEST 1 VIEW COMPARISON:  None. FINDINGS: Cardiac shadow is within normal limits. The lungs are well aerated bilaterally. Diffuse increased density is noted in the left  base consistent with early infiltrate. No bony abnormality is noted. IMPRESSION: Early infiltrate in the left base. Electronically Signed   By: Alcide Clever M.D.   On: 05/18/2020 17:39   ASSESSMENT AND PLAN:   Jackson Stark  is a 59 y.o. Caucasian male with a known history of COPD,  hypertension and type 2 diabetes mellitus, who presented to the emergency room with acute onset of worsening dyspnea with associated dry cough with generalized weakness over the last couple weeks.  He has had recurrent falls during this time.  H  He admits to nasal congestion without sore throat or earache.  He denied any loss of taste or smell.  Denies any fever or chills.  No nausea or vomiting or diarrhea.  1. Acute hypoxemic respiratory failure secondary to COVID-19 with heavy Tobacco abuse and underlying COPD -O2 protocol will be followed to keep O2 saturation above 93. -- Currently on 9 L high flow nasal cannula oxygen. No respiratory distress at present -- continue bronchodilators -- incentive spirometer/flutter valve  2. Multifocal pneumonia secondary to COVID-19. -on scheduled Mucinex and as needed Tussionex. -O2 protocol will be followed. -- on IV Remdesivir and IV steroid therapy with IV Solu-Medrol with elevated inflammatory markers. -CRP 8.7 -D dimer 1079 -cont vitamin D3, vitamin C, zinc sulfate, p.o. Pepcid and aspirin. -- Patient currently not in respiratory distress.  3.  Acute kidney injury with mild acidosis in the setting of type II diabetes uncontrolled and hypertension - appears cute superimposed on chronic kidney disease. --patient has been noncompliant to medication and follow-up with primary MD more than one year -- continue IV fluids with Bicarb gtt--labs improving -- nephrology consult appreciated -- renal ultrasound--no acute abnormality -came in with creat of 5.59--4.9 (unknown baseline)  4. Hypokalemia, hyponatremia and hypochloremia.. - likely secondary to volume depletion with associated anorexia and uncontrolled sugars --K 3.6--3.2 today -cont IVF for now  5. Uncontrolled type II diabetes with mild elevated AG secondary to medical noncompliance , poor po intake and currently being on steroids -hemoglobin A1c 13.8 -insulin Lantus 15 units BID and  sliding scale --diabetes coordinator input appreciated -pt will need to be discharged on insulin--TOC aware - Nursing to educate and teach pt to take his own insulin  6.  DVT prophylaxis. -Subcutaneous Lovenox.  7.uncontrolled HTN-new -started on hydralazine  8.Nutrition Status: Nutrition Problem: Moderate Malnutrition Etiology: chronic illness (COPD) Signs/Symptoms: moderate fat depletion, moderate muscle depletion, severe muscle depletion Interventions: Refer to RD note for recommendations   CODE STATUS: Full code  Status is: Inpatient  Remains inpatient appropriate because:Ongoing diagnostic testing needed not appropriate for outpatient work up, Unsafe d/c plan, IV treatments appropriate due to intensity of illness or inability to take PO and Inpatient level of care appropriate due to severity of illness   Dispo: The patient is from: Home  Anticipated d/c is to: Home  Anticipated d/c date is: > 3 days  Patient currently is not medically stable. Being treated for acute renal failure, uncontrolled type II diabetes and COVID pneumonia      TOTAL TIME TAKING CARE OF THIS PATIENT: 25* minutes.  >50% time spent on counselling and coordination of care  Note: This dictation was prepared with Dragon dictation along with smaller phrase technology. Any transcriptional errors that result from this process are unintentional.  Enedina Finner M.D    [facility]   CC: Primary care physician; Patient, No Pcp PerPatient ID: VERNOR MONNIG, male   DOB: 1961-06-29, 59 y.o.  MRN: [id number]

## 2020-05-17 NOTE — Progress Notes (Addendum)
Pt AAox4, VS stable, BP elevated this afternoon. Pt on 9L HFNC, with Spo2 78% and above. Pt periodically using non rebreather Sp02 100%  On non rebreather. Pt able to prone this afternoon. No complaints. Pt has good appetite eating meals and supplements. Pt self administered insulin today demonstrated proper technique please reinforce. Pt point of contact updated on POC. Will continue to monitor.

## 2020-05-18 DIAGNOSIS — E1365 Other specified diabetes mellitus with hyperglycemia: Secondary | ICD-10-CM | POA: Diagnosis not present

## 2020-05-18 DIAGNOSIS — N179 Acute kidney failure, unspecified: Secondary | ICD-10-CM | POA: Diagnosis not present

## 2020-05-18 DIAGNOSIS — J9601 Acute respiratory failure with hypoxia: Secondary | ICD-10-CM | POA: Diagnosis not present

## 2020-05-18 DIAGNOSIS — U071 COVID-19: Secondary | ICD-10-CM | POA: Diagnosis not present

## 2020-05-18 LAB — CBC WITH DIFFERENTIAL/PLATELET
Abs Immature Granulocytes: 0.05 10*3/uL (ref 0.00–0.07)
Basophils Absolute: 0 10*3/uL (ref 0.0–0.1)
Basophils Relative: 0 %
Eosinophils Absolute: 0 10*3/uL (ref 0.0–0.5)
Eosinophils Relative: 0 %
HCT: 28.8 % — ABNORMAL LOW (ref 39.0–52.0)
Hemoglobin: 10.7 g/dL — ABNORMAL LOW (ref 13.0–17.0)
Immature Granulocytes: 1 %
Lymphocytes Relative: 4 %
Lymphs Abs: 0.2 10*3/uL — ABNORMAL LOW (ref 0.7–4.0)
MCH: 29.1 pg (ref 26.0–34.0)
MCHC: 37.2 g/dL — ABNORMAL HIGH (ref 30.0–36.0)
MCV: 78.3 fL — ABNORMAL LOW (ref 80.0–100.0)
Monocytes Absolute: 0.2 10*3/uL (ref 0.1–1.0)
Monocytes Relative: 4 %
Neutro Abs: 5 10*3/uL (ref 1.7–7.7)
Neutrophils Relative %: 91 %
Platelets: 210 10*3/uL (ref 150–400)
RBC: 3.68 MIL/uL — ABNORMAL LOW (ref 4.22–5.81)
RDW: 12.4 % (ref 11.5–15.5)
WBC: 5.5 10*3/uL (ref 4.0–10.5)
nRBC: 0 % (ref 0.0–0.2)

## 2020-05-18 LAB — COMPREHENSIVE METABOLIC PANEL
ALT: 16 U/L (ref 0–44)
AST: 21 U/L (ref 15–41)
Albumin: 2.1 g/dL — ABNORMAL LOW (ref 3.5–5.0)
Alkaline Phosphatase: 72 U/L (ref 38–126)
Anion gap: 13 (ref 5–15)
BUN: 106 mg/dL — ABNORMAL HIGH (ref 6–20)
CO2: 20 mmol/L — ABNORMAL LOW (ref 22–32)
Calcium: 7.9 mg/dL — ABNORMAL LOW (ref 8.9–10.3)
Chloride: 95 mmol/L — ABNORMAL LOW (ref 98–111)
Creatinine, Ser: 4.97 mg/dL — ABNORMAL HIGH (ref 0.61–1.24)
GFR calc Af Amer: 14 mL/min — ABNORMAL LOW (ref >60)
GFR calc non Af Amer: 12 mL/min — ABNORMAL LOW (ref >60)
Glucose, Bld: 442 mg/dL — ABNORMAL HIGH (ref 70–99)
Potassium: 2.8 mmol/L — ABNORMAL LOW (ref 3.5–5.1)
Sodium: 128 mmol/L — ABNORMAL LOW (ref 135–145)
Total Bilirubin: 0.8 mg/dL (ref 0.3–1.2)
Total Protein: 5.2 g/dL — ABNORMAL LOW (ref 6.5–8.1)

## 2020-05-18 LAB — C4 COMPLEMENT: Complement C4, Body Fluid: 31 mg/dL (ref 12–38)

## 2020-05-18 LAB — PROTEIN ELECTROPHORESIS, SERUM
A/G Ratio: 0.8 (ref 0.7–1.7)
Albumin ELP: 2.1 g/dL — ABNORMAL LOW (ref 2.9–4.4)
Alpha-1-Globulin: 0.4 g/dL (ref 0.0–0.4)
Alpha-2-Globulin: 1.1 g/dL — ABNORMAL HIGH (ref 0.4–1.0)
Beta Globulin: 0.8 g/dL (ref 0.7–1.3)
Gamma Globulin: 0.4 g/dL (ref 0.4–1.8)
Globulin, Total: 2.7 g/dL (ref 2.2–3.9)
Total Protein ELP: 4.8 g/dL — ABNORMAL LOW (ref 6.0–8.5)

## 2020-05-18 LAB — ANCA TITERS
Atypical P-ANCA titer: <1:20 {titer}
C-ANCA: <1:20 {titer}
P-ANCA: <1:20 {titer}

## 2020-05-18 LAB — KAPPA/LAMBDA LIGHT CHAINS
Kappa free light chain: 117.6 mg/L — ABNORMAL HIGH (ref 3.3–19.4)
Kappa, lambda light chain ratio: 0.6 (ref 0.26–1.65)
Lambda free light chains: 195.8 mg/L — ABNORMAL HIGH (ref 5.7–26.3)

## 2020-05-18 LAB — GLUCOSE, CAPILLARY
Glucose-Capillary: 442 mg/dL — ABNORMAL HIGH (ref 70–99)
Glucose-Capillary: 460 mg/dL — ABNORMAL HIGH (ref 70–99)
Glucose-Capillary: 440 mg/dL — ABNORMAL HIGH (ref 70–99)
Glucose-Capillary: 467 mg/dL — ABNORMAL HIGH (ref 70–99)

## 2020-05-18 LAB — C-REACTIVE PROTEIN: CRP: 4.5 mg/dL — ABNORMAL HIGH (ref <1.0)

## 2020-05-18 LAB — ANA W/REFLEX IF POSITIVE: Anti Nuclear Antibody (ANA): NEGATIVE

## 2020-05-18 LAB — C3 COMPLEMENT: C3 Complement: 115 mg/dL (ref 82–167)

## 2020-05-18 LAB — PARATHYROID HORMONE, INTACT (NO CA): PTH: 96 pg/mL — ABNORMAL HIGH (ref 15–65)

## 2020-05-18 LAB — FIBRIN DERIVATIVES D-DIMER (ARMC ONLY): Fibrin derivatives D-dimer (ARMC): 925.84 ng/mL (FEU) — ABNORMAL HIGH (ref 0.00–499.00)

## 2020-05-18 MED ORDER — LINAGLIPTIN 5 MG PO TABS
5.0000 mg | ORAL_TABLET | Freq: Every day | ORAL | Status: DC
  Administered 2020-05-18 – 2020-05-23 (×6): 5 mg via ORAL
  Filled 2020-05-18 (×9): qty 1

## 2020-05-18 MED ORDER — INSULIN ASPART 100 UNIT/ML ~~LOC~~ SOLN
0.0000 [IU] | Freq: Three times a day (TID) | SUBCUTANEOUS | Status: DC
  Administered 2020-05-18: 20 [IU] via SUBCUTANEOUS
  Filled 2020-05-18: qty 1

## 2020-05-18 MED ORDER — INSULIN ASPART 100 UNIT/ML ~~LOC~~ SOLN
0.0000 [IU] | Freq: Three times a day (TID) | SUBCUTANEOUS | Status: DC
  Administered 2020-05-19: 4 [IU] via SUBCUTANEOUS
  Administered 2020-05-19: 15 [IU] via SUBCUTANEOUS
  Filled 2020-05-18 (×3): qty 1

## 2020-05-18 MED ORDER — INSULIN GLARGINE 100 UNIT/ML ~~LOC~~ SOLN
25.0000 [IU] | Freq: Two times a day (BID) | SUBCUTANEOUS | Status: DC
  Administered 2020-05-18: 25 [IU] via SUBCUTANEOUS
  Filled 2020-05-18 (×3): qty 0.25

## 2020-05-18 MED ORDER — INSULIN GLARGINE 100 UNIT/ML ~~LOC~~ SOLN
35.0000 [IU] | Freq: Two times a day (BID) | SUBCUTANEOUS | Status: DC
  Filled 2020-05-18 (×3): qty 0.35

## 2020-05-18 MED ORDER — AMLODIPINE BESYLATE 5 MG PO TABS
5.0000 mg | ORAL_TABLET | Freq: Every day | ORAL | Status: DC
  Administered 2020-05-18: 5 mg via ORAL
  Filled 2020-05-18: qty 1

## 2020-05-18 MED ORDER — LACTATED RINGERS IV SOLN: INTRAVENOUS | Status: DC

## 2020-05-18 MED ORDER — HYDRALAZINE HCL 50 MG PO TABS
50.0000 mg | ORAL_TABLET | Freq: Three times a day (TID) | ORAL | Status: DC
  Administered 2020-05-18 – 2020-05-20 (×7): 50 mg via ORAL
  Filled 2020-05-18 (×7): qty 1

## 2020-05-18 MED ORDER — INSULIN ASPART 100 UNIT/ML ~~LOC~~ SOLN
5.0000 [IU] | Freq: Three times a day (TID) | SUBCUTANEOUS | Status: DC
  Administered 2020-05-18 (×3): 5 [IU] via SUBCUTANEOUS
  Filled 2020-05-18 (×3): qty 1

## 2020-05-18 MED ORDER — INSULIN ASPART 100 UNIT/ML ~~LOC~~ SOLN
22.0000 [IU] | Freq: Once | SUBCUTANEOUS | Status: AC
  Administered 2020-05-18: 22:00:00 22 [IU] via SUBCUTANEOUS
  Filled 2020-05-18: qty 1

## 2020-05-18 MED ORDER — POTASSIUM CHLORIDE CRYS ER 20 MEQ PO TBCR
40.0000 meq | EXTENDED_RELEASE_TABLET | ORAL | Status: AC
  Administered 2020-05-18 (×3): 40 meq via ORAL
  Filled 2020-05-18 (×3): qty 2

## 2020-05-18 NOTE — Plan of Care (Signed)
  Problem: Education: Goal: Knowledge of General Education information will improve Description Including pain rating scale, medication(s)/side effects and non-pharmacologic comfort measures Outcome: Progressing   

## 2020-05-18 NOTE — Progress Notes (Addendum)
Neuro: AO x4, delayed CV: NSR- ST with some movement Resp: 11-15 L O2 85-98% depending on activity GI/GU: uses urinal or x1 to BsC, BM today Skin: encouraging q2 turns, bottom red, sacrum drsg  MD aware of elevated BP and blood sugar. Orders implemented, see MAR. Spoke with daughter today. Will continue to monitor.

## 2020-05-18 NOTE — Progress Notes (Signed)
PROGRESS NOTE    Jackson Stark  HFW:[id number] DOB: 18-Nov-1960 DOA: 05-21-2020 PCP: Patient, No Pcp Per    Brief Narrative:  Jackson Stark is a 59 year old male with past medical history notable for COPD, essential hypertension, type 2 diabetes mellitus who presented to the emergency department with progressive shortness of breath associated with dry cough and generalized weakness.  Also with recurrent falls.  In the ED, patient was noted to be hypoxic with SPO2 80% on room air with improvement to 93% on 10 L NRB.  Creatinine 5.78, anion gap 18, BNP 80.5.  Chest x-ray notable for left basal infiltrate.  Patient was started on IV Decadron and doxycycline and given 2 L bolus of normal saline.  TRH consulted for admission.   Assessment & Plan:   Active Problems:   Acute hypoxemic respiratory failure due to COVID-19 [facility])   Malnutrition of moderate degree   AKI (acute kidney injury) (HCC)   Generalized weakness   DM (diabetes mellitus), secondary uncontrolled (HCC)   Acute respiratory failure with hypoxia (HCC)   Acute hypoxic respiratory failure secondary to acute Covid-19 viral pneumonia during the ongoing 2020 Covid 19 Pandemic - POA Patient presenting to ED with progressive shortness of breath.  Was found to be hypoxic with SPO2 80% on room air on arrival.  Chest x-ray consistent with Covid-19 viral pneumonia. --COVID test: + 05-21-20 --CRP 8.7>6.2>4.5 --ddimer 1,079>905>925 --Remdesivir, plan 5-day course (Day #4/5) --Unable to start baricitinib 2/2 renal insufficiency/acute renal failure --Solumedrol 77mg  IV q12h; plan transition to prednisone 50mg  daily on 9/30 --prone for 2-3hrs every 12hrs if able --Continue supplemental oxygen, titrate to maintain SPO2 greater than 88%, currently on 11 L HFNC --Continue supportive care with albuterol MDI prn, vitamin C, zinc, Tylenol, antitussives (benzonatate/ Mucinex/Tussionex) --Follow CBC, CMP, D-dimer, ferritin, and CRP  daily --Continue airborne/contact isolation precautions for 3 weeks from the day of diagnosis  The treatment plan and use of medications and known side effects were discussed with patient/family. Some of the medications used are based on case reports/anecdotal data.  All other medications being used in the management of COVID-19 based on limited study data.  Complete risks and long-term side effects are unknown, however in the best clinical judgment they seem to be of some benefit.  Patient wanted to proceed with treatment options provided.  Acute renal failure Unknown baseline, poor outpatient follow-up/medical noncompliance.  Presented with creatinine 5.59.  Renal ultrasound with no acute abnormality. --Nephrology following, appreciate assistance --Continue sodium bicarb infusion at 100 mL/h --Avoid nephrotoxins, renally dose all medications --Follow BMP daily  Hypokalemia Potassium 2.8, will replete today. --Repeat electrolytes in a.m. to include magnesium  Type 2 diabetes mellitus Hemoglobin A1c 13.8, poorly controlled. --Tradjenta 5 mg p.o. daily --Increase Lantus to 25 units [place] BID --start Novolog 5u TIDAC --Resistant insulin sliding scale for coverage --CBGs qAC/HS  Essential hypertension BP 165/90 this morning. --Start amlodipine 5 mg p.o. daily --Increase hydralazine to 50 mg p.o. TID --Continue monitor BP closely and adjust as needed  Tobacco use disorder Counseled.   DVT prophylaxis: Lovenox Code Status: Full code Family Communication: Updated patient extensively at bedside  Disposition Plan:  Status is: Inpatient  Remains inpatient appropriate because:Persistent severe electrolyte disturbances, Ongoing diagnostic testing needed not appropriate for outpatient work up, Unsafe d/c plan, IV treatments appropriate due to intensity of illness or inability to take PO and Inpatient level of care appropriate due to severity of illness   Dispo: The patient is from:  Home  Anticipated d/c is to: Home              Anticipated d/c date is: > 3 days              Patient currently is not medically stable to d/c.   Consultants:   Nephrology  Procedures:   None  Antimicrobials:   None   Subjective: Patient seen and examined bedside, resting comfortably.  Continues with high oxygen needs, on 11 L high flow nasal cannula this morning.  States feels that his breathing continues to slowly improve each day, but far from his normal baseline.  No other complaints or concerns at this time.  Denies headache, no dizziness, no chest pain, palpitations, no abdominal pain, no weakness, no fatigue, no fever/chills/night sweats, no nausea/vomiting/diarrhea.  No acute events overnight per nursing staff.  Objective: Vitals:   05/18/20 0738 05/18/20 0800 05/18/20 0900 05/18/20 1156  BP: (!) 159/70   (!) 172/77  Pulse: 66 79 86 76  Resp: 20 (!) 21 19 20   Temp: 97.9 F (36.6 C)   (!) 97.5 F (36.4 C)  TempSrc: Oral   Oral  SpO2: 97% (!) 87% 94% 95%  Weight:      Height:        Intake/Output Summary (Last 24 hours) at 05/18/2020 1221 Last data filed at 05/18/2020 1220 Gross per 24 hour  Intake 480 ml  Output 2200 ml  Net -1720 ml   Filed Weights   2020-05-29 1707 05/16/20 1539  Weight: 77.1 kg 71.7 kg    Examination:  General exam: Appears calm and comfortable  Respiratory system: Decreased breath sounds bilateral bases, otherwise clear without crackles/wheezes/rhonchi, normal respiratory effort, on 11 L high flow nasal cannula Cardiovascular system: S1 & S2 heard, RRR. No JVD, murmurs, rubs, gallops or clicks. No pedal edema. Gastrointestinal system: Abdomen is nondistended, soft and nontender. No organomegaly or masses felt. Normal bowel sounds heard. Central nervous system: Alert and oriented. No focal neurological deficits. Extremities: Symmetric 5 x 5 power. Skin: No rashes, lesions or ulcers Psychiatry: Judgement and insight appear  normal. Mood & affect appropriate.     Data Reviewed: I have personally reviewed following labs and imaging studies  CBC: Recent Labs  Lab 05/29/2020 1714 05/16/20 0601 05/17/20 0541 05/18/20 0538  WBC 7.0 2.9* 5.1 5.5  NEUTROABS 5.9 2.5 4.4 5.0  HGB 13.2 11.8* 11.6* 10.7*  HCT 36.8* 32.7* 31.2* 28.8*  MCV 79.7* 79.6* 77.6* 78.3*  PLT 219 168 190 210   Basic Metabolic Panel: Recent Labs  Lab 2020/05/29 1714 05/16/20 0601 05/17/20 0541 05/18/20 0538  NA 127* 128* 131* 128*  K 3.1* 3.6 3.2* 2.8*  CL 92* 95* 96* 95*  CO2 17* 15* 18* 20*  GLUCOSE 321* 570* 227* 442*  BUN 86* 93* 94* 106*  CREATININE 5.78* 5.59* 4.91* 4.97*  CALCIUM 8.1* 7.8* 8.1* 7.9*   GFR: Estimated Creatinine Clearance: 16.6 mL/min (A) (by C-G formula based on SCr of 4.97 mg/dL (H)). Liver Function Tests: Recent Labs  Lab 29-May-2020 1714 05/16/20 0601 05/17/20 0541 05/18/20 0538  AST 28 23 26 21   ALT 16 15 14 16   ALKPHOS 84 74 69 72  BILITOT 1.1 1.0 0.9 0.8  PROT 6.8 5.9* 5.7* 5.2*  ALBUMIN 2.6* 2.3* 2.2* 2.1*   No results for input(s): LIPASE, AMYLASE in the last 168 hours. No results for input(s): AMMONIA in the last 168 hours. Coagulation Profile: No results for input(s): INR, PROTIME in the last 168 hours.  Cardiac Enzymes: No results for input(s): CKTOTAL, CKMB, CKMBINDEX, TROPONINI in the last 168 hours. BNP (last 3 results) No results for input(s): PROBNP in the last 8760 hours. HbA1C: Recent Labs    05/16/20 0601  HGBA1C 13.8*   CBG: Recent Labs  Lab 05/17/20 1204 05/17/20 1610 05/17/20 2126 05/18/20 0740 05/18/20 1149  GLUCAP 378* 377* 457* 442* 460*   Lipid Profile: No results for input(s): CHOL, HDL, LDLCALC, TRIG, CHOLHDL, LDLDIRECT in the last 72 hours. Thyroid Function Tests: No results for input(s): TSH, T4TOTAL, FREET4, T3FREE, THYROIDAB in the last 72 hours. Anemia Panel: No results for input(s): VITAMINB12, FOLATE, FERRITIN, TIBC, IRON, RETICCTPCT in the last  72 hours. Sepsis Labs: No results for input(s): PROCALCITON, LATICACIDVEN in the last 168 hours.  Recent Results (from the past 240 hour(s))  Respiratory Panel by RT PCR (Flu A&B, Covid) - Nasopharyngeal Swab     Status: Abnormal   Collection Time: 05/04/2020  5:14 PM   Specimen: Nasopharyngeal Swab  Result Value Ref Range Status   SARS Coronavirus 2 by RT PCR POSITIVE (A) NEGATIVE Final    Comment: RESULT CALLED TO, READ BACK BY AND VERIFIED WITH: LORRIE LEMONS ON 05/12/2020 AT 1851 QSD (NOTE) SARS-CoV-2 target nucleic acids are DETECTED.  SARS-CoV-2 RNA is generally detectable in upper respiratory specimens  during the acute phase of infection. Positive results are indicative of the presence of the identified virus, but do not rule out bacterial infection or co-infection with other pathogens not detected by the test. Clinical correlation with patient history and other diagnostic information is necessary to determine patient infection status. The expected result is Negative.  Fact Sheet for Patients:  https://www.moore.com/https://www.fda.gov/media/142436/download  Fact Sheet for Healthcare Providers: https://www.young.biz/https://www.fda.gov/media/142435/download  This test is not yet approved or cleared by the [place] FDA and  has been authorized for detection and/or diagnosis of SARS-CoV-2 by FDA under an Emergency Use Authorization (EUA).  This EUA will remain in effect (meaning this test can b e used) for the duration of  the COVID-19 declaration under Section 564(b)(1) of the Act, 21 U.S.C. section 360bbb-3(b)(1), unless the authorization is terminated or revoked sooner.      Influenza A by PCR NEGATIVE NEGATIVE Final   Influenza B by PCR NEGATIVE NEGATIVE Final    Comment: (NOTE) The Xpert Xpress SARS-CoV-2/FLU/RSV assay is intended as an aid in  the diagnosis of influenza from Nasopharyngeal swab specimens and  should not be used as a sole basis for treatment. Nasal washings and  aspirates are  unacceptable for Xpert Xpress SARS-CoV-2/FLU/RSV  testing.  Fact Sheet for Patients: https://www.moore.com/https://www.fda.gov/media/142436/download  Fact Sheet for Healthcare Providers: https://www.young.biz/https://www.fda.gov/media/142435/download  This test is not yet approved or cleared by the [place] FDA and  has been authorized for detection and/or diagnosis of SARS-CoV-2 by  FDA under an Emergency Use Authorization (EUA). This EUA will remain  in effect (meaning this test can be used) for the duration of the  Covid-19 declaration under Section 564(b)(1) of the Act, 21  U.S.C. section 360bbb-3(b)(1), unless the authorization is  terminated or revoked. Performed at [facility], [address] [place], [place] [address]   Blood culture (routine x 2)     Status: None (Preliminary result)   Collection Time: 05/03/2020  5:14 PM   Specimen: BLOOD  Result Value Ref Range Status   Specimen Description BLOOD LEFT ARM  Final   Special Requests   Final    BOTTLES DRAWN AEROBIC AND ANAEROBIC Blood Culture adequate volume  Culture   Final    NO GROWTH 3 DAYS Performed at [facility], [address] [place], [place] [address]    Report Status PENDING  Incomplete  Blood culture (routine x 2)     Status: None (Preliminary result)   Collection Time: 05/06/2020  5:14 PM   Specimen: BLOOD  Result Value Ref Range Status   Specimen Description BLOOD RIGHT ARM  Final   Special Requests   Final    BOTTLES DRAWN AEROBIC AND ANAEROBIC Blood Culture adequate volume   Culture   Final    NO GROWTH 3 DAYS Performed at [facility], [address], [place], [place] [address]    Report Status PENDING  Incomplete         Radiology Studies: US RENAL  Result Date: 05/16/2020 CLINICAL DATA:  Acute kidney injury EXAM: RENAL / URINARY TRACT ULTRASOUND COMPLETE COMPARISON:  None. FINDINGS: Right Kidney: Renal measurements: 11.6 x 6.1 x 4.2 cm = volume: 154 mL. Echogenicity within normal limits. No mass  or hydronephrosis visualized. Left Kidney: Renal measurements: 10.1 x 6.1 x 5.1 cm = volume: 162 mL. Echogenicity within normal limits. No mass or hydronephrosis visualized. Bladder: Appears normal for degree of bladder distention. Other: None. IMPRESSION: Negative renal ultrasound. Electronically Signed   By: Marlan Palau M.D.   On: 05/16/2020 17:06        Scheduled Meds: . vitamin C  500 mg Oral Daily  . aspirin EC  81 mg Oral Daily  . enoxaparin (LOVENOX) injection  30 mg Subcutaneous Q24H  . famotidine  10 mg Oral Daily  . feeding supplement (NEPRO CARB STEADY)  237 mL Oral TID BM  . guaiFENesin  600 mg Oral BID  . hydrALAZINE  50 mg Oral TID  . influenza vac split quadrivalent PF  0.5 mL Intramuscular Tomorrow-1000  . insulin aspart  0-20 Units Subcutaneous TID WC  . insulin aspart  0-5 Units Subcutaneous QHS  . insulin aspart  5 Units Subcutaneous TID WC  . insulin glargine  25 Units Subcutaneous BID  . linagliptin  5 mg Oral Daily  . multivitamin with minerals  1 tablet Oral Daily  . pneumococcal 23 valent vaccine  0.5 mL Intramuscular Tomorrow-1000  . potassium chloride  40 mEq Oral Q3H  . [START ON 05/19/2020] predniSONE  50 mg Oral Daily  . zinc sulfate  220 mg Oral Daily   Continuous Infusions: . remdesivir 100 mg in NS 100 mL 100 mg (05/18/20 0858)  .  sodium bicarbonate (isotonic) infusion in sterile water 100 mL/hr at 05/18/20 0855     LOS: 3 days    Time spent: 39 minutes spent on chart review, discussion with nursing staff, consultants, updating family and interview/physical exam; more than 50% of that time was spent in counseling and/or coordination of care.    Alvira Philips [place], DO [facility] Available via Epic secure chat 7am-7pm After these hours, please refer to coverage provider listed on [email or web address] 05/18/2020, 12:21 PM

## 2020-05-18 NOTE — Consult Note (Signed)
PHARMACY CONSULT NOTE - FOLLOW UP  Pharmacy Consult for Electrolyte Monitoring and Replacement   Recent Labs: Potassium (mmol/L)  Date Value  05/18/2020 2.8 (L)   Calcium (mg/dL)  Date Value  56/15/3794 7.9 (L)   Albumin (g/dL)  Date Value  32/76/1470 2.1 (L)   Sodium (mmol/L)  Date Value  05/18/2020 128 (L)     Assessment: NathanTidwellis a58 y.o.malewith a known history of COPD, hypertension and type 2 diabetes mellitus, who presented to the emergency room with acute onset of worsening dyspnea/COVID PNA and hyperglycemia.  BG currently 229 on solumedrol, SSI, and glargine 15 units bid.  Pharmacy has been consulted to monitor/replenish K. 9/29 K 2.8  Goal of Therapy:  K ~ 4  Plan:  MD has ordered KCl PO q3h times 3 doses. Will follow up on AM labs.  Jackson Stark, PharmD, BCPS 05/18/2020 1:41 PM

## 2020-05-18 NOTE — Progress Notes (Signed)
Inpatient Diabetes Program Recommendations  AACE/ADA: New Consensus Statement on Inpatient Glycemic Control (2015)  Target Ranges:  Prepandial:   less than 140 mg/dL      Peak postprandial:   less than 180 mg/dL (1-2 hours)      Critically ill patients:  140 - 180 mg/dL   Lab Results  Component Value Date   GLUCAP 460 (H) 05/18/2020   HGBA1C 13.8 (H) 05/16/2020    Review of Glycemic Control Results for Jackson Stark, Jackson Stark (MRN [id number]) as of 05/18/2020 11:57  Ref. Range 05/17/2020 12:04 05/17/2020 16:10 05/17/2020 21:26 05/18/2020 07:40 05/18/2020 11:49  Glucose-Capillary Latest Ref Range: 70 - 99 mg/dL 536 (H) 144 (H) 315 (H) 442 (H) 460 (H)   Diabetes history: DM 2-  Outpatient Diabetes medications: None Current orders for Inpatient glycemic control:  Solumedrol 76.875 q 12 hours Novolog moderate tid with meals and HS Lantus 25 units bid Novolog 5 units tid with meals.  Inpatient Diabetes Program Recommendations:    CBG's continue to be elevated.  Note increases in Lantus and the addition of Novolog meal coverage.  May also consider increasing Novolog correction to "resistant" q 4 hours while on steroids.   Thanks,  Beryl Meager, RN, BC-ADM Inpatient Diabetes Coordinator Pager [phone] (8a-5p)

## 2020-05-18 NOTE — Plan of Care (Signed)

## 2020-05-18 NOTE — Progress Notes (Addendum)
[facility]  ROUNDING NOTE   Subjective:   Patient alert, oriented, in no acute distress. He is on 9L supplemental O2 via high flow nasal canula, denies SOB. 09/28 0701 - 09/29 0700 In: 960 [P.O.:960] Out: 2900 [Urine:2900]  Lab Results  Component Value Date   CREATININE 4.97 (H) 05/18/2020   CREATININE 4.91 (H) 05/17/2020   CREATININE 5.59 (H) 05/16/2020     Objective:  Vital signs in last 24 hours:  Temp:  [97.3 F (36.3 C)-97.9 F (36.6 C)] 97.9 F (36.6 C) (09/29 0738) Pulse Rate:  [66-99] 86 (09/29 0900) Resp:  [16-21] 19 (09/29 0900) BP: (159-188)/(66-90) 159/70 (09/29 0738) SpO2:  [87 %-100 %] 94 % (09/29 0900)  Weight change:  Filed Weights   05/16/2020 1707 05/16/20 1539  Weight: 77.1 kg 71.7 kg    Intake/Output: I/O last 3 completed shifts: In: 960 [P.O.:960] Out: 3300 [Urine:3300]   Intake/Output this shift:  Total I/O In: -  Out: 300 [Urine:300]  Physical Exam: General: In no acute distress  Head:  Normocephalic,Atraumatic,High flow nasal canula in place  Eyes: Anicteric  Lungs:  Clear to auscultation  Heart: S1S2 + Regular rate and rhythm  Abdomen:  Soft, nontender, non distended  Extremities: No  peripheral edema.  Neurologic: Alert,oriented  Skin: No acute rashes or  lesions       Basic Metabolic Panel: Recent Labs  Lab 05/19/2020 1714 05/09/2020 1714 05/16/20 0601 05/17/20 0541 05/18/20 0538  NA 127*  --  128* 131* 128*  K 3.1*  --  3.6 3.2* 2.8*  CL 92*  --  95* 96* 95*  CO2 17*  --  15* 18* 20*  GLUCOSE 321*  --  570* 227* 442*  BUN 86*  --  93* 94* 106*  CREATININE 5.78*  --  5.59* 4.91* 4.97*  CALCIUM 8.1*   < > 7.8* 8.1* 7.9*   < > = values in this interval not displayed.    Liver Function Tests: Recent Labs  Lab 04/28/2020 1714 05/16/20 0601 05/17/20 0541 05/18/20 0538  AST 28 23 26 21   ALT 16 15 14 16   ALKPHOS 84 74 69 72  BILITOT 1.1 1.0 0.9 0.8  PROT 6.8 5.9* 5.7* 5.2*  ALBUMIN 2.6* 2.3* 2.2* 2.1*    No results for input(s): LIPASE, AMYLASE in the last 168 hours. No results for input(s): AMMONIA in the last 168 hours.  CBC: Recent Labs  Lab 05/09/2020 1714 05/16/20 0601 05/17/20 0541 05/18/20 0538  WBC 7.0 2.9* 5.1 5.5  NEUTROABS 5.9 2.5 4.4 5.0  HGB 13.2 11.8* 11.6* 10.7*  HCT 36.8* 32.7* 31.2* 28.8*  MCV 79.7* 79.6* 77.6* 78.3*  PLT 219 168 190 210    Cardiac Enzymes: No results for input(s): CKTOTAL, CKMB, CKMBINDEX, TROPONINI in the last 168 hours.  BNP: Invalid input(s): POCBNP  CBG: Recent Labs  Lab 05/17/20 0801 05/17/20 1204 05/17/20 1610 05/17/20 2126 05/18/20 0740  GLUCAP 229* 378* 377* 457* 442*    Microbiology: Results for orders placed or performed during the hospital encounter of 05/08/2020  Respiratory Panel by RT PCR (Flu A&B, Covid) - Nasopharyngeal Swab     Status: Abnormal   Collection Time: 05/04/2020  5:14 PM   Specimen: Nasopharyngeal Swab  Result Value Ref Range Status   SARS Coronavirus 2 by RT PCR POSITIVE (A) NEGATIVE Final    Comment: RESULT CALLED TO, READ BACK BY AND VERIFIED WITH: LORRIE LEMONS ON 04/21/2020 AT 1851 QSD (NOTE) SARS-CoV-2 target nucleic acids are DETECTED.  SARS-CoV-2 RNA is generally detectable in upper respiratory specimens  during the acute phase of infection. Positive results are indicative of the presence of the identified virus, but do not rule out bacterial infection or co-infection with other pathogens not detected by the test. Clinical correlation with patient history and other diagnostic information is necessary to determine patient infection status. The expected result is Negative.  Fact Sheet for Patients:  https://www.moore.com/  Fact Sheet for Healthcare Providers: https://www.young.biz/  This test is not yet approved or cleared by the [place] FDA and  has been authorized for detection and/or diagnosis of SARS-CoV-2 by FDA under an Emergency Use  Authorization (EUA).  This EUA will remain in effect (meaning this test can b e used) for the duration of  the COVID-19 declaration under Section 564(b)(1) of the Act, 21 U.S.C. section 360bbb-3(b)(1), unless the authorization is terminated or revoked sooner.      Influenza A by PCR NEGATIVE NEGATIVE Final   Influenza B by PCR NEGATIVE NEGATIVE Final    Comment: (NOTE) The Xpert Xpress SARS-CoV-2/FLU/RSV assay is intended as an aid in  the diagnosis of influenza from Nasopharyngeal swab specimens and  should not be used as a sole basis for treatment. Nasal washings and  aspirates are unacceptable for Xpert Xpress SARS-CoV-2/FLU/RSV  testing.  Fact Sheet for Patients: https://www.moore.com/  Fact Sheet for Healthcare Providers: https://www.young.biz/  This test is not yet approved or cleared by the [place] FDA and  has been authorized for detection and/or diagnosis of SARS-CoV-2 by  FDA under an Emergency Use Authorization (EUA). This EUA will remain  in effect (meaning this test can be used) for the duration of the  Covid-19 declaration under Section 564(b)(1) of the Act, 21  U.S.C. section 360bbb-3(b)(1), unless the authorization is  terminated or revoked. Performed at [facility], [address] [place], [place] [address]   Blood culture (routine x 2)     Status: None (Preliminary result)   Collection Time: 05/16/2020  5:14 PM   Specimen: BLOOD  Result Value Ref Range Status   Specimen Description BLOOD LEFT ARM  Final   Special Requests   Final    BOTTLES DRAWN AEROBIC AND ANAEROBIC Blood Culture adequate volume   Culture   Final    NO GROWTH 3 DAYS Performed at [facility], [address], [place], [place] [address]    Report Status PENDING  Incomplete  Blood culture (routine x 2)     Status: None (Preliminary result)   Collection Time: 05/17/2020  5:14 PM   Specimen: BLOOD  Result Value Ref Range  Status   Specimen Description BLOOD RIGHT ARM  Final   Special Requests   Final    BOTTLES DRAWN AEROBIC AND ANAEROBIC Blood Culture adequate volume   Culture   Final    NO GROWTH 3 DAYS Performed at [facility], [address], [place], [place] [address]    Report Status PENDING  Incomplete    Coagulation Studies: No results for input(s): LABPROT, INR in the last 72 hours.  Urinalysis: No results for input(s): COLORURINE, LABSPEC, PHURINE, GLUCOSEU, HGBUR, BILIRUBINUR, KETONESUR, PROTEINUR, UROBILINOGEN, NITRITE, LEUKOCYTESUR in the last 72 hours.  Invalid input(s): APPERANCEUR    Imaging: US RENAL  Result Date: 05/16/2020 CLINICAL DATA:  Acute kidney injury EXAM: RENAL / URINARY TRACT ULTRASOUND COMPLETE COMPARISON:  None. FINDINGS: Right Kidney: Renal measurements: 11.6 x 6.1 x 4.2 cm = volume: 154 mL. Echogenicity within normal limits. No mass  or hydronephrosis visualized. Left Kidney: Renal measurements: 10.1 x 6.1 x 5.1 cm = volume: 162 mL. Echogenicity within normal limits. No mass or hydronephrosis visualized. Bladder: Appears normal for degree of bladder distention. Other: None. IMPRESSION: Negative renal ultrasound. Electronically Signed   By: Marlan Palau M.D.   On: 05/16/2020 17:06     Medications:   . remdesivir 100 mg in NS 100 mL 100 mg (05/18/20 0858)  .  sodium bicarbonate (isotonic) infusion in sterile water 100 mL/hr at 05/18/20 0855   . vitamin C  500 mg Oral Daily  . aspirin EC  81 mg Oral Daily  . enoxaparin (LOVENOX) injection  30 mg Subcutaneous Q24H  . famotidine  10 mg Oral Daily  . feeding supplement (NEPRO CARB STEADY)  237 mL Oral TID BM  . guaiFENesin  600 mg Oral BID  . hydrALAZINE  50 mg Oral TID  . influenza vac split quadrivalent PF  0.5 mL Intramuscular Tomorrow-1000  . insulin aspart  0-15 Units Subcutaneous TID WC  . insulin aspart  0-5 Units Subcutaneous QHS  . insulin aspart  5 Units Subcutaneous TID WC  . insulin glargine   25 Units Subcutaneous BID  . multivitamin with minerals  1 tablet Oral Daily  . pneumococcal 23 valent vaccine  0.5 mL Intramuscular Tomorrow-1000  . potassium chloride  40 mEq Oral Q3H  . [START ON 05/19/2020] predniSONE  50 mg Oral Daily  . zinc sulfate  220 mg Oral Daily   acetaminophen, chlorpheniramine-HYDROcodone, guaiFENesin-dextromethorphan, magnesium hydroxide, ondansetron **OR** ondansetron (ZOFRAN) IV, traZODone  Assessment/ Plan:  Mr. BREEZE ANGELL is a 59 y.o.  male presented from home for shortness of breath and increasing weakness.  Hypoxic on room air as per EMS.  Diagnosed with Covid pneumonia.  He has recurrent falls, nasal congestion.  Lab results showed acidosis, hyponatremia, hypokalemia, increased creatinine, low albumin.h/o diabetes for 20 years, was not compliant with medications for the past one year.  #Acute kidney injury Creatinine 4.97 ,slightly elevated from yesterday Renal US Normal Will continue following up labs Continue iv hydration, change fluids to lactated ringers Electrolytes and Volume status are acceptable No acute indication for Dialysis at present   #Diabetes type 2, poorly controlled Last HbA1C: 13.8 on 05/16/20 Currently on sliding scale and long acting insulins, blood sugar readings stays elevated  #Multifocal pneumonia secondary to COVID-19 Patient is on Remdesivir and steroids Required non breather during this admission SpO2 stays above 90% on high flow O2  #Hyponatremia Na+ today is 128 Will continue monitoring labs  #Hypokalemia K+  2.8 today Potassium supplements increased today  Fluids changed to LR    LOS: 3 Nandana Krolikowski 9/29/20219:24 AM   This patient was seen and examined with Ms Denna Haggard, NP She assisted with exam and transcription of the note.

## 2020-05-19 DIAGNOSIS — U071 COVID-19: Secondary | ICD-10-CM | POA: Diagnosis not present

## 2020-05-19 DIAGNOSIS — J9601 Acute respiratory failure with hypoxia: Secondary | ICD-10-CM | POA: Diagnosis not present

## 2020-05-19 DIAGNOSIS — N179 Acute kidney failure, unspecified: Secondary | ICD-10-CM | POA: Diagnosis not present

## 2020-05-19 DIAGNOSIS — E1365 Other specified diabetes mellitus with hyperglycemia: Secondary | ICD-10-CM | POA: Diagnosis not present

## 2020-05-19 LAB — CBC WITH DIFFERENTIAL/PLATELET
Abs Immature Granulocytes: 0.13 10*3/uL — ABNORMAL HIGH (ref 0.00–0.07)
Basophils Absolute: 0 10*3/uL (ref 0.0–0.1)
Basophils Relative: 0 %
Eosinophils Absolute: 0 10*3/uL (ref 0.0–0.5)
Eosinophils Relative: 0 %
HCT: 28.6 % — ABNORMAL LOW (ref 39.0–52.0)
Hemoglobin: 10.9 g/dL — ABNORMAL LOW (ref 13.0–17.0)
Immature Granulocytes: 2 %
Lymphocytes Relative: 3 %
Lymphs Abs: 0.2 10*3/uL — ABNORMAL LOW (ref 0.7–4.0)
MCH: 28.9 pg (ref 26.0–34.0)
MCHC: 38.1 g/dL — ABNORMAL HIGH (ref 30.0–36.0)
MCV: 75.9 fL — ABNORMAL LOW (ref 80.0–100.0)
Monocytes Absolute: 0.3 10*3/uL (ref 0.1–1.0)
Monocytes Relative: 4 %
Neutro Abs: 7.3 10*3/uL (ref 1.7–7.7)
Neutrophils Relative %: 91 %
Platelets: 194 10*3/uL (ref 150–400)
RBC: 3.77 MIL/uL — ABNORMAL LOW (ref 4.22–5.81)
RDW: 12.3 % (ref 11.5–15.5)
WBC: 8 10*3/uL (ref 4.0–10.5)
nRBC: 0 % (ref 0.0–0.2)

## 2020-05-19 LAB — COMPREHENSIVE METABOLIC PANEL
ALT: 15 U/L (ref 0–44)
AST: 25 U/L (ref 15–41)
Albumin: 2.2 g/dL — ABNORMAL LOW (ref 3.5–5.0)
Alkaline Phosphatase: 74 U/L (ref 38–126)
Anion gap: 14 (ref 5–15)
BUN: 103 mg/dL — ABNORMAL HIGH (ref 6–20)
CO2: 22 mmol/L (ref 22–32)
Calcium: 8.1 mg/dL — ABNORMAL LOW (ref 8.9–10.3)
Chloride: 101 mmol/L (ref 98–111)
Creatinine, Ser: 4.41 mg/dL — ABNORMAL HIGH (ref 0.61–1.24)
GFR calc Af Amer: 16 mL/min — ABNORMAL LOW (ref >60)
GFR calc non Af Amer: 14 mL/min — ABNORMAL LOW (ref >60)
Glucose, Bld: 126 mg/dL — ABNORMAL HIGH (ref 70–99)
Potassium: 3 mmol/L — ABNORMAL LOW (ref 3.5–5.1)
Sodium: 137 mmol/L (ref 135–145)
Total Bilirubin: 0.7 mg/dL (ref 0.3–1.2)
Total Protein: 5.3 g/dL — ABNORMAL LOW (ref 6.5–8.1)

## 2020-05-19 LAB — GLUCOSE, CAPILLARY
Glucose-Capillary: 64 mg/dL — ABNORMAL LOW (ref 70–99)
Glucose-Capillary: 74 mg/dL (ref 70–99)
Glucose-Capillary: 178 mg/dL — ABNORMAL HIGH (ref 70–99)
Glucose-Capillary: 304 mg/dL — ABNORMAL HIGH (ref 70–99)
Glucose-Capillary: 343 mg/dL — ABNORMAL HIGH (ref 70–99)

## 2020-05-19 LAB — FIBRIN DERIVATIVES D-DIMER (ARMC ONLY): Fibrin derivatives D-dimer (ARMC): 1475.09 ng/mL (FEU) — ABNORMAL HIGH (ref 0.00–499.00)

## 2020-05-19 LAB — MAGNESIUM: Magnesium: 2 mg/dL (ref 1.7–2.4)

## 2020-05-19 LAB — C-REACTIVE PROTEIN: CRP: 3 mg/dL — ABNORMAL HIGH (ref <1.0)

## 2020-05-19 MED ORDER — AMLODIPINE BESYLATE 10 MG PO TABS
10.0000 mg | ORAL_TABLET | Freq: Every day | ORAL | Status: DC
  Administered 2020-05-19 – 2020-05-24 (×6): 10 mg via ORAL
  Filled 2020-05-19 (×6): qty 1

## 2020-05-19 MED ORDER — INSULIN ASPART 100 UNIT/ML ~~LOC~~ SOLN
0.0000 [IU] | Freq: Every day | SUBCUTANEOUS | Status: DC
  Administered 2020-05-20: 22:00:00 5 [IU] via SUBCUTANEOUS
  Administered 2020-05-21: 2 [IU] via SUBCUTANEOUS
  Administered 2020-05-22: 3 [IU] via SUBCUTANEOUS
  Administered 2020-05-23: 4 [IU] via SUBCUTANEOUS
  Administered 2020-05-24: 3 [IU] via SUBCUTANEOUS
  Filled 2020-05-19 (×5): qty 1

## 2020-05-19 MED ORDER — INSULIN GLARGINE 100 UNIT/ML ~~LOC~~ SOLN
25.0000 [IU] | Freq: Two times a day (BID) | SUBCUTANEOUS | Status: DC
  Administered 2020-05-19 (×2): 25 [IU] via SUBCUTANEOUS
  Filled 2020-05-19 (×5): qty 0.25

## 2020-05-19 MED ORDER — POTASSIUM CHLORIDE CRYS ER 20 MEQ PO TBCR
30.0000 meq | EXTENDED_RELEASE_TABLET | ORAL | Status: AC
  Administered 2020-05-19 (×3): 30 meq via ORAL
  Filled 2020-05-19 (×3): qty 1

## 2020-05-19 NOTE — Progress Notes (Signed)
Hypoglycemic Event  CBG: 64  Treatment: 4 oz orange juice  Symptoms: asymptomatic  Follow-up CBG: Time:0807* CBG Result:74  Possible Reasons for Event: history of SSI orders and PO intake  Comments/MD notified:Dr [place] in the pt's room upon rounds; see [facility] for new orders    Bijal Siglin Luiz Blare

## 2020-05-19 NOTE — Progress Notes (Signed)
[facility]  ROUNDING NOTE   Subjective:   Patient resting in bed, in no acute distress.O2 requirement reduced to 8L via HFNC today.  09/29 0701 - 09/30 0700 In: 2334 [P.O.:960; I.V.:800; NG/GT:474; IV Piggyback:100] Out: 2425 [Urine:2425]  Lab Results  Component Value Date   CREATININE 4.41 (H) 05/19/2020   CREATININE 4.97 (H) 05/18/2020   CREATININE 4.91 (H) 05/17/2020     Objective:  Vital signs in last 24 hours:  Temp:  [97.5 F (36.4 C)-98.8 F (37.1 C)] 98.8 F (37.1 C) (09/30 1452) Pulse Rate:  [70-99] 84 (09/30 1452) Resp:  [15-21] 20 (09/30 1452) BP: (129-178)/(52-87) 149/53 (09/30 1452) SpO2:  [92 %-100 %] 93 % (09/30 1452)  Weight change:  Filed Weights   19-May-2020 1707 05/16/20 1539  Weight: 77.1 kg 71.7 kg    Intake/Output: I/O last 3 completed shifts: In: 2814 [P.O.:1440; I.V.:800; NG/GT:474; IV Piggyback:100] Out: 3925 [Urine:3925]   Intake/Output this shift:  Total I/O In: 1090.5 [P.O.:120; I.V.:870.5; IV Piggyback:100] Out: 550 [Urine:550]  Physical Exam: General: Resting in bed  Head:  High flow nasal canula in place  Eyes: Anicteric  Lungs:  Normal symmetrical respirations, lungs clear  Heart: Regular rate and rhythm  Abdomen:  Nontender, non distended  Extremities: No  peripheral edema.  Neurologic: Alert,oriented  Skin: No acute rashes or  lesions       Basic Metabolic Panel: Recent Labs  Lab 05-19-20 1714 May 19, 2020 1714 05/16/20 0601 05/16/20 0601 05/17/20 0541 05/18/20 0538 05/19/20 0320  NA 127*  --  128*  --  131* 128* 137  K 3.1*  --  3.6  --  3.2* 2.8* 3.0*  CL 92*  --  95*  --  96* 95* 101  CO2 17*  --  15*  --  18* 20* 22  GLUCOSE 321*  --  570*  --  227* 442* 126*  BUN 86*  --  93*  --  94* 106* 103*  CREATININE 5.78*  --  5.59*  --  4.91* 4.97* 4.41*  CALCIUM 8.1*   < > 7.8*   < > 8.1* 7.9* 8.1*  MG  --   --   --   --   --   --  2.0   < > = values in this interval not displayed.    Liver  Function Tests: Recent Labs  Lab 19-May-2020 1714 05/16/20 0601 05/17/20 0541 05/18/20 0538 05/19/20 0320  AST 28 23 26 21 25   ALT 16 15 14 16 15   ALKPHOS 84 74 69 72 74  BILITOT 1.1 1.0 0.9 0.8 0.7  PROT 6.8 5.9* 5.7* 5.2* 5.3*  ALBUMIN 2.6* 2.3* 2.2* 2.1* 2.2*   No results for input(s): LIPASE, AMYLASE in the last 168 hours. No results for input(s): AMMONIA in the last 168 hours.  CBC: Recent Labs  Lab 19-May-2020 1714 05/16/20 0601 05/17/20 0541 05/18/20 0538 05/19/20 0320  WBC 7.0 2.9* 5.1 5.5 8.0  NEUTROABS 5.9 2.5 4.4 5.0 7.3  HGB 13.2 11.8* 11.6* 10.7* 10.9*  HCT 36.8* 32.7* 31.2* 28.8* 28.6*  MCV 79.7* 79.6* 77.6* 78.3* 75.9*  PLT 219 168 190 210 194    Cardiac Enzymes: No results for input(s): CKTOTAL, CKMB, CKMBINDEX, TROPONINI in the last 168 hours.  BNP: Invalid input(s): POCBNP  CBG: Recent Labs  Lab 05/18/20 1639 05/18/20 2000 05/19/20 0752 05/19/20 0807 05/19/20 1129  GLUCAP 440* 467* 64* 74 178*    Microbiology: Results for orders placed or performed during the hospital  encounter of Jun 03, 2020  Respiratory Panel by RT PCR (Flu A&B, Covid) - Nasopharyngeal Swab     Status: Abnormal   Collection Time: 06-03-2020  5:14 PM   Specimen: Nasopharyngeal Swab  Result Value Ref Range Status   SARS Coronavirus 2 by RT PCR POSITIVE (A) NEGATIVE Final    Comment: RESULT CALLED TO, READ BACK BY AND VERIFIED WITH: LORRIE LEMONS ON Jun 03, 2020 AT 1851 QSD (NOTE) SARS-CoV-2 target nucleic acids are DETECTED.  SARS-CoV-2 RNA is generally detectable in upper respiratory specimens  during the acute phase of infection. Positive results are indicative of the presence of the identified virus, but do not rule out bacterial infection or co-infection with other pathogens not detected by the test. Clinical correlation with patient history and other diagnostic information is necessary to determine patient infection status. The expected result is Negative.  Fact Sheet for  Patients:  https://www.moore.com/  Fact Sheet for Healthcare Providers: https://www.young.biz/  This test is not yet approved or cleared by the [place] FDA and  has been authorized for detection and/or diagnosis of SARS-CoV-2 by FDA under an Emergency Use Authorization (EUA).  This EUA will remain in effect (meaning this test can b e used) for the duration of  the COVID-19 declaration under Section 564(b)(1) of the Act, 21 U.S.C. section 360bbb-3(b)(1), unless the authorization is terminated or revoked sooner.      Influenza A by PCR NEGATIVE NEGATIVE Final   Influenza B by PCR NEGATIVE NEGATIVE Final    Comment: (NOTE) The Xpert Xpress SARS-CoV-2/FLU/RSV assay is intended as an aid in  the diagnosis of influenza from Nasopharyngeal swab specimens and  should not be used as a sole basis for treatment. Nasal washings and  aspirates are unacceptable for Xpert Xpress SARS-CoV-2/FLU/RSV  testing.  Fact Sheet for Patients: https://www.moore.com/  Fact Sheet for Healthcare Providers: https://www.young.biz/  This test is not yet approved or cleared by the [place] FDA and  has been authorized for detection and/or diagnosis of SARS-CoV-2 by  FDA under an Emergency Use Authorization (EUA). This EUA will remain  in effect (meaning this test can be used) for the duration of the  Covid-19 declaration under Section 564(b)(1) of the Act, 21  U.S.C. section 360bbb-3(b)(1), unless the authorization is  terminated or revoked. Performed at [facility], [address] [place], [place] [address]   Blood culture (routine x 2)     Status: None (Preliminary result)   Collection Time: June 03, 2020  5:14 PM   Specimen: BLOOD  Result Value Ref Range Status   Specimen Description BLOOD LEFT ARM  Final   Special Requests   Final    BOTTLES DRAWN AEROBIC AND ANAEROBIC Blood Culture adequate volume    Culture   Final    NO GROWTH 4 DAYS Performed at [facility], [address], [place], [place] [address]    Report Status PENDING  Incomplete  Blood culture (routine x 2)     Status: None (Preliminary result)   Collection Time: 06/03/2020  5:14 PM   Specimen: BLOOD  Result Value Ref Range Status   Specimen Description BLOOD RIGHT ARM  Final   Special Requests   Final    BOTTLES DRAWN AEROBIC AND ANAEROBIC Blood Culture adequate volume   Culture   Final    NO GROWTH 4 DAYS Performed at [facility], [address], [place], [place] [address]    Report Status PENDING  Incomplete    Coagulation Studies: No results for input(s):  LABPROT, INR in the last 72 hours.  Urinalysis: No results for input(s): COLORURINE, LABSPEC, PHURINE, GLUCOSEU, HGBUR, BILIRUBINUR, KETONESUR, PROTEINUR, UROBILINOGEN, NITRITE, LEUKOCYTESUR in the last 72 hours.  Invalid input(s): APPERANCEUR    Imaging: No results found.   Medications:   . lactated ringers 75 mL/hr at 05/19/20 1127   . amLODipine  10 mg Oral Daily  . vitamin C  500 mg Oral Daily  . aspirin EC  81 mg Oral Daily  . enoxaparin (LOVENOX) injection  30 mg Subcutaneous Q24H  . famotidine  10 mg Oral Daily  . feeding supplement (NEPRO CARB STEADY)  237 mL Oral TID BM  . guaiFENesin  600 mg Oral BID  . hydrALAZINE  50 mg Oral TID  . influenza vac split quadrivalent PF  0.5 mL Intramuscular Tomorrow-1000  . insulin aspart  0-20 Units Subcutaneous TID AC & HS  . insulin aspart  0-5 Units Subcutaneous QHS  . insulin glargine  25 Units Subcutaneous BID  . linagliptin  5 mg Oral Daily  . multivitamin with minerals  1 tablet Oral Daily  . pneumococcal 23 valent vaccine  0.5 mL Intramuscular Tomorrow-1000  . potassium chloride  30 mEq Oral Q3H  . predniSONE  50 mg Oral Daily  . zinc sulfate  220 mg Oral Daily   acetaminophen, chlorpheniramine-HYDROcodone, guaiFENesin-dextromethorphan, magnesium hydroxide, ondansetron  **OR** ondansetron (ZOFRAN) IV, traZODone  Assessment/ Plan:  Jackson Stark is a 59 y.o.  male presented from home for shortness of breath and increasing weakness.  Hypoxic on room air as per EMS.  Diagnosed with Covid pneumonia.  He has recurrent falls, nasal congestion.  Lab results showed acidosis, hyponatremia, hypokalemia, increased creatinine, low albumin.h/o diabetes for 20 years, was not compliant with medications for the past one year.  #Acute kidney injury Creatinine improving gradually, 4.41 today Continue IV Fluids No acute indication for Dialysis at present   #Diabetes type 2, poorly controlled Last HbA1C: 13.8 on 05/16/20 On long acting and sliding scale insulin regimen  #Multifocal pneumonia secondary to COVID-19 Patient is on Remdesivir and steroids Required non breather during this admission SpO2 stays above 90% on high flow O2 8L   #Hyponatremia Na+ normalized to 137   #Hypokalemia K+ improved to 3.0 today, still low Continue oral Potassium supplements     LOS: 4 Zavion Sleight 9/30/20213:08 PM

## 2020-05-19 NOTE — Consult Note (Signed)
PHARMACY CONSULT NOTE - FOLLOW UP  Pharmacy Consult for Electrolyte Monitoring and Replacement   Recent Labs: Potassium (mmol/L)  Date Value  05/19/2020 3.0 (L)   Magnesium (mg/dL)  Date Value  06/77/0340 2.0   Calcium (mg/dL)  Date Value  35/24/8185 8.1 (L)   Albumin (g/dL)  Date Value  90/93/1121 2.2 (L)   Sodium (mmol/L)  Date Value  05/19/2020 137     Assessment: NathanTidwellis a58 y.o.malewith a known history of COPD, hypertension and type 2 diabetes mellitus, who presented to the emergency room with acute onset of worsening dyspnea/COVID PNA and hyperglycemia.  BG currently 229 on solumedrol, SSI, and glargine 15 units bid.  Pharmacy has been consulted to monitor/replenish K. 9/29 K 3.0  Goal of Therapy:  K ~ 4  Plan:  MD has ordered KCl PO q3h times 3 doses. Will follow up on AM labs.  Jackson Stark, Jackson Stark, Jackson Stark 05/19/2020 9:33 AM

## 2020-05-19 NOTE — Progress Notes (Signed)
Inpatient Diabetes Program Recommendations  AACE/ADA: New Consensus Statement on Inpatient Glycemic Control (2015)  Target Ranges:  Prepandial:   less than 140 mg/dL      Peak postprandial:   less than 180 mg/dL (1-2 hours)      Critically ill patients:  140 - 180 mg/dL   Lab Results  Component Value Date   GLUCAP 74 05/19/2020   HGBA1C 13.8 (H) 05/16/2020    Review of Glycemic Control Results for Jackson Stark, Jackson Stark (MRN [id number]) as of 05/19/2020 10:27  Ref. Range 05/18/2020 11:49 05/18/2020 16:39 05/18/2020 20:00 05/19/2020 07:52 05/19/2020 08:07  Glucose-Capillary Latest Ref Range: 70 - 99 mg/dL 322 (H) Novolog 20 units 440 (H) Novolog 25 units 467 (H) Novolog 22 units 64 (L) 74   Diabetes history: DM 2-  Outpatient Diabetes medications: None Current orders for Inpatient glycemic control:  Prednisone 50 mg qd Novolog resistant tid with meals and HS Lantus 25 units bid Novolog 5 units tid with meals.   Inpatient Diabetes Program Recommendations:   Fasting CBG 64 this am after receiving Novolog 22 units correction for hs -Decrease Novolog hs correction to 0-5 units  Will follow during hospitalization.  Thank you, Billy Fischer. Evelynn Hench, RN, MSN, CDE  Diabetes Coordinator Inpatient Glycemic Control Team Team Pager [phone] (8am-5pm) 05/19/2020 10:39 AM

## 2020-05-19 NOTE — Progress Notes (Addendum)
PROGRESS NOTE    Jackson Stark  WUJ:[id number] DOB: 07/24/1961 DOA: 12/16/19 PCP: Patient, No Pcp Per    Brief Narrative:  Jackson Stark is a 59 year old male with past medical history notable for COPD, essential hypertension, type 2 diabetes mellitus who presented to the emergency department with progressive shortness of breath associated with dry cough and generalized weakness.  Also with recurrent falls.  In the ED, patient was noted to be hypoxic with SPO2 80% on room air with improvement to 93% on 10 L NRB.  Creatinine 5.78, anion gap 18, BNP 80.5.  Chest x-ray notable for left basal infiltrate.  Patient was started on IV Decadron and doxycycline and given 2 L bolus of normal saline.  TRH consulted for admission.   Assessment & Plan:   Active Problems:   Acute hypoxemic respiratory failure due to COVID-19 [facility](HCC)   Malnutrition of moderate degree   AKI (acute kidney injury) (HCC)   Generalized weakness   DM (diabetes mellitus), secondary uncontrolled (HCC)   Acute respiratory failure with hypoxia (HCC)   Acute hypoxic respiratory failure secondary to acute Covid-19 viral pneumonia during the ongoing 2020 Covid 19 Pandemic - POA Patient presenting to ED with progressive shortness of breath.  Was found to be hypoxic with SPO2 80% on room air on arrival.  Chest x-ray consistent with Covid-19 viral pneumonia. --COVID test: + 12/16/19 --CRP 8.7>6.2>4.5>3.0 --ddimer 1,079>905>925>1475 --Remdesivir, plan 5-day course (Day #5/5) --Unable to start baricitinib 2/2 renal insufficiency/acute renal failure --IV steroids transitioned to prednisone 50mg  daily on 9/30 --prone for 2-3hrs every 12hrs if able --Continue supplemental oxygen, titrate to maintain SPO2 greater than 88%, currently on 8 L HFNC --Continue supportive care with albuterol MDI prn, vitamin C, zinc, Tylenol, antitussives (benzonatate/ Mucinex/Tussionex) --Follow CBC, CMP, D-dimer, ferritin, and CRP daily --Continue  airborne/contact isolation precautions for 3 weeks from the day of diagnosis  The treatment plan and use of medications and known side effects were discussed with patient/family. Some of the medications used are based on case reports/anecdotal data.  All other medications being used in the management of COVID-19 based on limited study data.  Complete risks and long-term side effects are unknown, however in the best clinical judgment they seem to be of some benefit.  Patient wanted to proceed with treatment options provided.  Acute renal failure Unknown baseline, poor outpatient follow-up/medical noncompliance.  Presented with creatinine 5.59.  Renal ultrasound with no acute abnormality. --Nephrology following, appreciate assistance --Cr 5.59>4.41 --Continue LR at 100 mL/h --Avoid nephrotoxins, renally dose all medications --Follow BMP daily  Hypokalemia Potassium 3.0, magnesium 2.0.  Will replete K today. --Repeat electrolytes in a.m. to include magnesium  Type 2 diabetes mellitus Hemoglobin A1c 13.8, poorly controlled. --Tradjenta 5 mg p.o. daily --Lantus 25 units Roe BID --Resistant insulin sliding scale for coverage --CBGs qAC/HS --Continue to monitor glucose closely, may need reduction in dose now titrating down steroids  Essential hypertension BP 129/52 this morning. --Started amlodipine 10 mg p.o. daily --Hydralazine to 50 mg p.o. TID --Continue monitor BP closely and adjust as needed  Tobacco use disorder Counseled.   DVT prophylaxis: Lovenox Code Status: Full code Family Communication: Updated patient extensively at bedside  Disposition Plan:  Status is: Inpatient  Remains inpatient appropriate because:Persistent severe electrolyte disturbances, Ongoing diagnostic testing needed not appropriate for outpatient work up, Unsafe d/c plan, IV treatments appropriate due to intensity of illness or inability to take PO and Inpatient level of care appropriate due to severity of  illness  Dispo: The patient is from: Home              Anticipated d/c is to: Home              Anticipated d/c date is: > 3 days              Patient currently is not medically stable to d/c.   Consultants:   Nephrology  Procedures:   Renal ultrasound  Antimicrobials:   None   Subjective: Patient seen and examined bedside, resting comfortably.  Oxygen down to 8 L from 11 L yesterday.  Continues to report that breathing slowly improving but not back to his normal baseline.  No other complaints or concerns at this time.  Patient with low glucose this morning 62; asymptomatic.  Denies visual changes, no headache, no dizziness, no chest pain, no palpitations, no fever/chills/night sweats, no nausea/vomiting/diarrhea, no weakness, no fatigue, no abdominal pain.  No other acute events overnight per nursing staff.  Objective: Vitals:   05/19/20 0000 05/19/20 0614 05/19/20 0755 05/19/20 1100  BP: (!) 166/87 (!) 155/71 (!) 150/69 (!) 129/52  Pulse: 93 71 99 70  Resp: 16 18 20 15   Temp: (!) 97.5 F (36.4 C) (!) 97.5 F (36.4 C)  98.8 F (37.1 C)  TempSrc: Oral Oral    SpO2: 92% 96% 95% 100%  Weight:      Height:        Intake/Output Summary (Last 24 hours) at 05/19/2020 1241 Last data filed at 05/19/2020 1205 Gross per 24 hour  Intake 2707.47 ml  Output 2275 ml  Net 432.47 ml   Filed Weights   04/28/2020 1707 05/16/20 1539  Weight: 77.1 kg 71.7 kg    Examination:  General exam: Appears calm and comfortable  Respiratory system: Decreased breath sounds bilateral bases, otherwise clear without crackles/wheezes/rhonchi, normal respiratory effort, on 8L high flow nasal cannula Cardiovascular system: S1 & S2 heard, RRR. No JVD, murmurs, rubs, gallops or clicks. No pedal edema. Gastrointestinal system: Abdomen is nondistended, soft and nontender. No organomegaly or masses felt. Normal bowel sounds heard. Central nervous system: Alert and oriented. No focal neurological  deficits. Extremities: Symmetric 5 x 5 power. Skin: No rashes, lesions or ulcers Psychiatry: Judgement and insight appear normal. Mood & affect appropriate.     Data Reviewed: I have personally reviewed following labs and imaging studies  CBC: Recent Labs  Lab 05/14/2020 1714 05/16/20 0601 05/17/20 0541 05/18/20 0538 05/19/20 0320  WBC 7.0 2.9* 5.1 5.5 8.0  NEUTROABS 5.9 2.5 4.4 5.0 7.3  HGB 13.2 11.8* 11.6* 10.7* 10.9*  HCT 36.8* 32.7* 31.2* 28.8* 28.6*  MCV 79.7* 79.6* 77.6* 78.3* 75.9*  PLT 219 168 190 210 194   Basic Metabolic Panel: Recent Labs  Lab 04/27/2020 1714 05/16/20 0601 05/17/20 0541 05/18/20 0538 05/19/20 0320  NA 127* 128* 131* 128* 137  K 3.1* 3.6 3.2* 2.8* 3.0*  CL 92* 95* 96* 95* 101  CO2 17* 15* 18* 20* 22  GLUCOSE 321* 570* 227* 442* 126*  BUN 86* 93* 94* 106* 103*  CREATININE 5.78* 5.59* 4.91* 4.97* 4.41*  CALCIUM 8.1* 7.8* 8.1* 7.9* 8.1*  MG  --   --   --   --  2.0   GFR: Estimated Creatinine Clearance: 18.7 mL/min (A) (by C-G formula based on SCr of 4.41 mg/dL (H)). Liver Function Tests: Recent Labs  Lab 05/07/2020 1714 05/16/20 0601 05/17/20 0541 05/18/20 0538 05/19/20 0320  AST 28 23 26 21  25  ALT 16 15 14 16 15   ALKPHOS 84 74 69 72 74  BILITOT 1.1 1.0 0.9 0.8 0.7  PROT 6.8 5.9* 5.7* 5.2* 5.3*  ALBUMIN 2.6* 2.3* 2.2* 2.1* 2.2*   No results for input(s): LIPASE, AMYLASE in the last 168 hours. No results for input(s): AMMONIA in the last 168 hours. Coagulation Profile: No results for input(s): INR, PROTIME in the last 168 hours. Cardiac Enzymes: No results for input(s): CKTOTAL, CKMB, CKMBINDEX, TROPONINI in the last 168 hours. BNP (last 3 results) No results for input(s): PROBNP in the last 8760 hours. HbA1C: No results for input(s): HGBA1C in the last 72 hours. CBG: Recent Labs  Lab 05/18/20 1639 05/18/20 2000 05/19/20 0752 05/19/20 0807 05/19/20 1129  GLUCAP 440* 467* 64* 74 178*   Lipid Profile: No results for  input(s): CHOL, HDL, LDLCALC, TRIG, CHOLHDL, LDLDIRECT in the last 72 hours. Thyroid Function Tests: No results for input(s): TSH, T4TOTAL, FREET4, T3FREE, THYROIDAB in the last 72 hours. Anemia Panel: No results for input(s): VITAMINB12, FOLATE, FERRITIN, TIBC, IRON, RETICCTPCT in the last 72 hours. Sepsis Labs: No results for input(s): PROCALCITON, LATICACIDVEN in the last 168 hours.  Recent Results (from the past 240 hour(s))  Respiratory Panel by RT PCR (Flu A&B, Covid) - Nasopharyngeal Swab     Status: Abnormal   Collection Time: 04/23/2020  5:14 PM   Specimen: Nasopharyngeal Swab  Result Value Ref Range Status   SARS Coronavirus 2 by RT PCR POSITIVE (A) NEGATIVE Final    Comment: RESULT CALLED TO, READ BACK BY AND VERIFIED WITH: LORRIE LEMONS ON 04/25/2020 AT 1851 QSD (NOTE) SARS-CoV-2 target nucleic acids are DETECTED.  SARS-CoV-2 RNA is generally detectable in upper respiratory specimens  during the acute phase of infection. Positive results are indicative of the presence of the identified virus, but do not rule out bacterial infection or co-infection with other pathogens not detected by the test. Clinical correlation with patient history and other diagnostic information is necessary to determine patient infection status. The expected result is Negative.  Fact Sheet for Patients:  05/17/20  Fact Sheet for Healthcare Providers: https://www.moore.com/  This test is not yet approved or cleared by the https://www.young.biz/ FDA and  has been authorized for detection and/or diagnosis of SARS-CoV-2 by FDA under an Emergency Use Authorization (EUA).  This EUA will remain in effect (meaning this test can b e used) for the duration of  the COVID-19 declaration under Section 564(b)(1) of the Act, 21 U.S.C. section 360bbb-3(b)(1), unless the authorization is terminated or revoked sooner.      Influenza A by PCR NEGATIVE NEGATIVE Final    Influenza B by PCR NEGATIVE NEGATIVE Final    Comment: (NOTE) The Xpert Xpress SARS-CoV-2/FLU/RSV assay is intended as an aid in  the diagnosis of influenza from Nasopharyngeal swab specimens and  should not be used as a sole basis for treatment. Nasal washings and  aspirates are unacceptable for Xpert Xpress SARS-CoV-2/FLU/RSV  testing.  Fact Sheet for Patients: [place]  Fact Sheet for Healthcare Providers: https://www.moore.com/  This test is not yet approved or cleared by the https://www.young.biz/ FDA and  has been authorized for detection and/or diagnosis of SARS-CoV-2 by  FDA under an Emergency Use Authorization (EUA). This EUA will remain  in effect (meaning this test can be used) for the duration of the  Covid-19 declaration under Section 564(b)(1) of the Act, 21  U.S.C. section 360bbb-3(b)(1), unless the authorization is  terminated or revoked. Performed at [facility],  [address] [place], [place] [address]   Blood culture (routine x 2)     Status: None (Preliminary result)   Collection Time: 04-Jun-2020  5:14 PM   Specimen: BLOOD  Result Value Ref Range Status   Specimen Description BLOOD LEFT ARM  Final   Special Requests   Final    BOTTLES DRAWN AEROBIC AND ANAEROBIC Blood Culture adequate volume   Culture   Final    NO GROWTH 4 DAYS Performed at [facility], [address] [place], [place] [address]    Report Status PENDING  Incomplete  Blood culture (routine x 2)     Status: None (Preliminary result)   Collection Time: Jun 04, 2020  5:14 PM   Specimen: BLOOD  Result Value Ref Range Status   Specimen Description BLOOD RIGHT ARM  Final   Special Requests   Final    BOTTLES DRAWN AEROBIC AND ANAEROBIC Blood Culture adequate volume   Culture   Final    NO GROWTH 4 DAYS Performed at [facility], [address], [place], [place] [address]    Report Status PENDING  Incomplete          Radiology Studies: No results found.      Scheduled Meds: . amLODipine  10 mg Oral Daily  . vitamin C  500 mg Oral Daily  . aspirin EC  81 mg Oral Daily  . enoxaparin (LOVENOX) injection  30 mg Subcutaneous Q24H  . famotidine  10 mg Oral Daily  . feeding supplement (NEPRO CARB STEADY)  237 mL Oral TID BM  . guaiFENesin  600 mg Oral BID  . hydrALAZINE  50 mg Oral TID  . influenza vac split quadrivalent PF  0.5 mL Intramuscular Tomorrow-1000  . insulin aspart  0-20 Units Subcutaneous TID AC & HS  . insulin aspart  0-5 Units Subcutaneous QHS  . insulin glargine  25 Units Subcutaneous BID  . linagliptin  5 mg Oral Daily  . multivitamin with minerals  1 tablet Oral Daily  . pneumococcal 23 valent vaccine  0.5 mL Intramuscular Tomorrow-1000  . potassium chloride  30 mEq Oral Q3H  . predniSONE  50 mg Oral Daily  . zinc sulfate  220 mg Oral Daily   Continuous Infusions: . lactated ringers 75 mL/hr at 05/19/20 1127     LOS: 4 days    Time spent: 38 minutes spent on chart review, discussion with nursing staff, consultants, updating family and interview/physical exam; more than 50% of that time was spent in counseling and/or coordination of care.    Alvira Philips [place], DO [facility] Available via Epic secure chat 7am-7pm After these hours, please refer to coverage provider listed on [email or web address] 05/19/2020, 12:41 PM

## 2020-05-20 ENCOUNTER — Inpatient Hospital Stay: Payer: Medicaid Other

## 2020-05-20 DIAGNOSIS — J9601 Acute respiratory failure with hypoxia: Secondary | ICD-10-CM | POA: Diagnosis not present

## 2020-05-20 DIAGNOSIS — E1365 Other specified diabetes mellitus with hyperglycemia: Secondary | ICD-10-CM | POA: Diagnosis not present

## 2020-05-20 DIAGNOSIS — R7989 Other specified abnormal findings of blood chemistry: Secondary | ICD-10-CM

## 2020-05-20 DIAGNOSIS — U071 COVID-19: Secondary | ICD-10-CM | POA: Diagnosis not present

## 2020-05-20 DIAGNOSIS — N179 Acute kidney failure, unspecified: Secondary | ICD-10-CM | POA: Diagnosis not present

## 2020-05-20 LAB — COMPREHENSIVE METABOLIC PANEL
ALT: 22 U/L (ref 0–44)
AST: 37 U/L (ref 15–41)
Albumin: 2.2 g/dL — ABNORMAL LOW (ref 3.5–5.0)
Alkaline Phosphatase: 69 U/L (ref 38–126)
Anion gap: 11 (ref 5–15)
BUN: 99 mg/dL — ABNORMAL HIGH (ref 6–20)
CO2: 23 mmol/L (ref 22–32)
Calcium: 8 mg/dL — ABNORMAL LOW (ref 8.9–10.3)
Chloride: 103 mmol/L (ref 98–111)
Creatinine, Ser: 4.11 mg/dL — ABNORMAL HIGH (ref 0.61–1.24)
GFR calc Af Amer: 17 mL/min — ABNORMAL LOW (ref >60)
GFR calc non Af Amer: 15 mL/min — ABNORMAL LOW (ref >60)
Glucose, Bld: 209 mg/dL — ABNORMAL HIGH (ref 70–99)
Potassium: 3.6 mmol/L (ref 3.5–5.1)
Sodium: 137 mmol/L (ref 135–145)
Total Bilirubin: 0.7 mg/dL (ref 0.3–1.2)
Total Protein: 5.1 g/dL — ABNORMAL LOW (ref 6.5–8.1)

## 2020-05-20 LAB — CBC WITH DIFFERENTIAL/PLATELET
Abs Immature Granulocytes: 0.43 10*3/uL — ABNORMAL HIGH (ref 0.00–0.07)
Basophils Absolute: 0 10*3/uL (ref 0.0–0.1)
Basophils Relative: 0 %
Eosinophils Absolute: 0 10*3/uL (ref 0.0–0.5)
Eosinophils Relative: 0 %
HCT: 29.1 % — ABNORMAL LOW (ref 39.0–52.0)
Hemoglobin: 10.8 g/dL — ABNORMAL LOW (ref 13.0–17.0)
Immature Granulocytes: 5 %
Lymphocytes Relative: 5 %
Lymphs Abs: 0.4 10*3/uL — ABNORMAL LOW (ref 0.7–4.0)
MCH: 28.8 pg (ref 26.0–34.0)
MCHC: 37.1 g/dL — ABNORMAL HIGH (ref 30.0–36.0)
MCV: 77.6 fL — ABNORMAL LOW (ref 80.0–100.0)
Monocytes Absolute: 0.4 10*3/uL (ref 0.1–1.0)
Monocytes Relative: 5 %
Neutro Abs: 6.8 10*3/uL (ref 1.7–7.7)
Neutrophils Relative %: 85 %
Platelets: 191 10*3/uL (ref 150–400)
RBC: 3.75 MIL/uL — ABNORMAL LOW (ref 4.22–5.81)
RDW: 12.6 % (ref 11.5–15.5)
WBC: 8 10*3/uL (ref 4.0–10.5)
nRBC: 0 % (ref 0.0–0.2)

## 2020-05-20 LAB — GLUCOSE, CAPILLARY
Glucose-Capillary: 191 mg/dL — ABNORMAL HIGH (ref 70–99)
Glucose-Capillary: 208 mg/dL — ABNORMAL HIGH (ref 70–99)
Glucose-Capillary: 300 mg/dL — ABNORMAL HIGH (ref 70–99)
Glucose-Capillary: 354 mg/dL — ABNORMAL HIGH (ref 70–99)

## 2020-05-20 LAB — MAGNESIUM: Magnesium: 2.2 mg/dL (ref 1.7–2.4)

## 2020-05-20 LAB — CULTURE, BLOOD (ROUTINE X 2)
Culture: NO GROWTH
Culture: NO GROWTH
Special Requests: ADEQUATE
Special Requests: ADEQUATE

## 2020-05-20 LAB — FIBRIN DERIVATIVES D-DIMER (ARMC ONLY): Fibrin derivatives D-dimer (ARMC): 2118.18 ng/mL (FEU) — ABNORMAL HIGH (ref 0.00–499.00)

## 2020-05-20 LAB — C-REACTIVE PROTEIN: CRP: 2.6 mg/dL — ABNORMAL HIGH (ref <1.0)

## 2020-05-20 MED ORDER — HYDRALAZINE HCL 50 MG PO TABS
75.0000 mg | ORAL_TABLET | Freq: Three times a day (TID) | ORAL | Status: DC
  Administered 2020-05-20 – 2020-05-21 (×3): 75 mg via ORAL
  Filled 2020-05-20 (×3): qty 2

## 2020-05-20 MED ORDER — FLUTICASONE PROPIONATE 50 MCG/ACT NA SUSP
2.0000 | Freq: Every day | NASAL | Status: DC
  Administered 2020-05-20 – 2020-05-24 (×5): 2 via NASAL
  Filled 2020-05-20: qty 16

## 2020-05-20 MED ORDER — ENOXAPARIN SODIUM 80 MG/0.8ML ~~LOC~~ SOLN
1.0000 mg/kg | SUBCUTANEOUS | Status: DC
  Administered 2020-05-20 – 2020-05-25 (×6): 72.5 mg via SUBCUTANEOUS
  Filled 2020-05-20 (×6): qty 0.8

## 2020-05-20 MED ORDER — INSULIN ASPART 100 UNIT/ML ~~LOC~~ SOLN
0.0000 [IU] | Freq: Three times a day (TID) | SUBCUTANEOUS | Status: DC
  Administered 2020-05-20: 13:00:00 7 [IU] via SUBCUTANEOUS
  Administered 2020-05-20: 08:00:00 4 [IU] via SUBCUTANEOUS
  Administered 2020-05-20 – 2020-05-21 (×3): 11 [IU] via SUBCUTANEOUS
  Administered 2020-05-21: 20 [IU] via SUBCUTANEOUS
  Administered 2020-05-22 (×2): 15 [IU] via SUBCUTANEOUS
  Administered 2020-05-22: 08:00:00 7 [IU] via SUBCUTANEOUS
  Administered 2020-05-23: 17:00:00 10 [IU] via SUBCUTANEOUS
  Administered 2020-05-24 (×2): 7 [IU] via SUBCUTANEOUS
  Filled 2020-05-20 (×14): qty 1

## 2020-05-20 MED ORDER — METHYLPREDNISOLONE SODIUM SUCC 125 MG IJ SOLR
60.0000 mg | Freq: Two times a day (BID) | INTRAMUSCULAR | Status: DC
  Administered 2020-05-20 – 2020-05-25 (×12): 60 mg via INTRAVENOUS
  Filled 2020-05-20 (×12): qty 2

## 2020-05-20 MED ORDER — POTASSIUM CHLORIDE CRYS ER 20 MEQ PO TBCR
40.0000 meq | EXTENDED_RELEASE_TABLET | Freq: Once | ORAL | Status: AC
  Administered 2020-05-20: 40 meq via ORAL
  Filled 2020-05-20: qty 2

## 2020-05-20 MED ORDER — FUROSEMIDE 10 MG/ML IJ SOLN
40.0000 mg | Freq: Once | INTRAMUSCULAR | Status: AC
  Administered 2020-05-20: 40 mg via INTRAVENOUS
  Filled 2020-05-20: qty 4

## 2020-05-20 MED ORDER — SALINE SPRAY 0.65 % NA SOLN
1.0000 | NASAL | Status: DC | PRN
  Administered 2020-05-20: 1 via NASAL
  Filled 2020-05-20: qty 44

## 2020-05-20 MED ORDER — INSULIN GLARGINE 100 UNIT/ML ~~LOC~~ SOLN
30.0000 [IU] | Freq: Two times a day (BID) | SUBCUTANEOUS | Status: DC
  Administered 2020-05-20 (×2): 30 [IU] via SUBCUTANEOUS
  Filled 2020-05-20 (×6): qty 0.3

## 2020-05-20 NOTE — Progress Notes (Signed)
PROGRESS NOTE    Jackson Stark  FTD:[id number] DOB: 1961/06/20 DOA: 2020-06-03 PCP: Patient, No Pcp Per    Brief Narrative:  Jackson Stark is a 59 year old male with past medical history notable for COPD, essential hypertension, type 2 diabetes mellitus who presented to the emergency department with progressive shortness of breath associated with dry cough and generalized weakness.  Also with recurrent falls.  In the ED, patient was noted to be hypoxic with SPO2 80% on room air with improvement to 93% on 10 L NRB.  Creatinine 5.78, anion gap 18, BNP 80.5.  Chest x-ray notable for left basal infiltrate.  Patient was started on IV Decadron and doxycycline and given 2 L bolus of normal saline.  TRH consulted for admission.   Assessment & Plan:   Active Problems:   Acute hypoxemic respiratory failure due to COVID-19 [facility])   Malnutrition of moderate degree   AKI (acute kidney injury) (HCC)   Generalized weakness   DM (diabetes mellitus), secondary uncontrolled (HCC)   Acute respiratory failure with hypoxia (HCC)   Acute hypoxic respiratory failure secondary to acute Covid-19 viral pneumonia during the ongoing 2020 Covid 19 Pandemic - POA Patient presenting to ED with progressive shortness of breath.  Was found to be hypoxic with SPO2 80% on room air on arrival.  Chest x-ray consistent with Covid-19 viral pneumonia; with repeat x-ray on 05/20/2020 showing worsening progression of multifocal pneumonia. --COVID test: + 06-03-20 --CRP 8.7>6.2>4.5>3.0> pending this morning --ddimer 1,079>905>925>1475>2118 --Completed 5-day course of remdesivir on 05/19/2020 --Unable to start baricitinib 2/2 renal insufficiency/acute renal failure --Increase steroids to Solu-Medrol 60 mg IV every 12 hours due to worsening hypoxia; and worsening findings on chest x-ray this morning --prone for 2-3hrs every 12hrs if able --Continue supplemental oxygen, titrate to maintain SPO2 greater than 85%, overnight  oxygen demand has increased now requiring 15 L HFNC and NRB --Continue supportive care with albuterol MDI prn, vitamin C, zinc, Tylenol, antitussives (benzonatate/ Mucinex/Tussionex) --Follow CBC, CMP, D-dimer, ferritin, and CRP daily --Continue airborne/contact isolation precautions for 3 weeks from the day of diagnosis  The treatment plan and use of medications and known side effects were discussed with patient/family. Some of the medications used are based on case reports/anecdotal data.  All other medications being used in the management of COVID-19 based on limited study data.  Complete risks and long-term side effects are unknown, however in the best clinical judgment they seem to be of some benefit.  Patient wanted to proceed with treatment options provided.  Elevated D-dimer D-dimer now trending up despite treatment for Covid-19, 905>925>1475>2118.  Concern of blood clot whether DVT versus PE given known hypercoagulable state with Covid-19 infection coupled with poor mobility.  Unable to obtain CT angiogram chest due to renal failure.  Unable to obtain nuclear medicine VQ scan due to patient's inability to lay flat for 20 minutes with resultant worsening of his hypoxia. --Check ultrasound duplex bilateral lower extremity to evaluate for DVT --Start treatment dose Lovenox --Continue to follow D-dimer daily  Acute renal failure Unknown baseline, poor outpatient follow-up/medical noncompliance.  Presented with creatinine 5.59.  Renal ultrasound with no acute abnormality. --Nephrology following, appreciate assistance --Cr 5.59>4.41>4.11 --Continue LR at 100 mL/h --Avoid nephrotoxins, renally dose all medications --Follow BMP daily  Hypokalemia Potassium 3.6, Will replete K today. --Repeat electrolytes in a.m. to include magnesium  Type 2 diabetes mellitus Hemoglobin A1c 13.8, poorly controlled. --Tradjenta 5 mg p.o. daily --Increase Lantus to 30 units [place] BID --Resistant insulin  sliding scale for coverage --CBGs qAC/HS --Continue to monitor glucose closely, may need reduction in dose now titrating down steroids  Essential hypertension BP 142/66 this morning. (range 148/82 - 153/73 past 24h) --Started amlodipine 10 mg p.o. daily --Increase hydralazine to 75 mg p.o. TID --Continue monitor BP closely and adjust as needed  Tobacco use disorder Counseled.   DVT prophylaxis: Lovenox Code Status: Full code Family Communication: Updated patient extensively at bedside  Disposition Plan:  Status is: Inpatient  Remains inpatient appropriate because:Persistent severe electrolyte disturbances, Ongoing diagnostic testing needed not appropriate for outpatient work up, Unsafe d/c plan, IV treatments appropriate due to intensity of illness or inability to take PO and Inpatient level of care appropriate due to severity of illness   Dispo: The patient is from: Home              Anticipated d/c is to: Home              Anticipated d/c date is: > 3 days              Patient currently is not medically stable to d/c.   Consultants:   Nephrology  Procedures:   Renal ultrasound  Antimicrobials:   None   Subjective: Called by nursing this morning regarding increased oxygen needs.  Apparently around 5 3 this morning patient was desaturating on his 7-8 L per nasal cannula.  Initially turned up to 11 L.  Early this morning patient continued to desaturate and was placed on 15 L high flow nasal cannula with 15 L NRB, with adequate oxygenation at 100%.  When attempts to remove NRB, he fairly quickly desaturates.  Chest x-ray this morning shows worsening multifocal pneumonia, especially on the left with increased consolidation.  Patient is afebrile without leukocytosis.  Patient states he is asymptomatic and was concerned that his coffee and breakfast were taken away from him.  Also reports nasal congestion.  Given his now trending up D-dimer, concern for embolism, DVT versus PE.   Unable to perform CT angiogram chest due to his renal failure and patient unable to lie flat for 20 minutes to obtain a VQ scan.  Will empirically start treatment dose Lovenox and increase his steroids.  If further decompensates or needs higher amounts of supple oxygen, will need transfer to progressive versus stepdown unit.  Patient denies any other issues or concerns at this time.  Denies headache, no visual changes, no chest pain, no dizziness, no palpitations, no fever/chills/night sweats, no nausea cefonicid diarrhea, no fatigue, no abdominal pain, no weakness.  No other acute concerns per nursing staff at this time.   Objective: Vitals:   05/20/20 0818 05/20/20 0825 05/20/20 0828 05/20/20 0905  BP:    (!) 148/82  Pulse: 97  83   Resp:      Temp:      TempSrc:      SpO2: (!) 89% (!) 83% 94%   Weight:      Height:        Intake/Output Summary (Last 24 hours) at 05/20/2020 0947 Last data filed at 05/20/2020 0400 Gross per 24 hour  Intake 879.55 ml  Output 2150 ml  Net -1270.45 ml   Filed Weights   08/05/2020 1707 05/16/20 1539  Weight: 77.1 kg 71.7 kg    Examination:  General exam: Appears calm and comfortable, chronically ill in appearance Respiratory system: Decreased breath sounds bilateral bases, with crackles bilateral bases, no wheezing/rhonchi, normal respiratory effort without accessory muscle use, on 15L  high flow nasal cannula and 15 L NRB Cardiovascular system: S1 & S2 heard, RRR. No JVD, murmurs, rubs, gallops or clicks. No pedal edema. Gastrointestinal system: Abdomen is nondistended, soft and nontender. No organomegaly or masses felt. Normal bowel sounds heard. Central nervous system: Alert and oriented. No focal neurological deficits. Extremities: Symmetric 5 x 5 power. Skin: No rashes, lesions or ulcers Psychiatry: Judgement and insight appear normal. Mood & affect appropriate.     Data Reviewed: I have personally reviewed following labs and imaging  studies  CBC: Recent Labs  Lab 05/16/20 0601 05/17/20 0541 05/18/20 0538 05/19/20 0320 05/20/20 0359  WBC 2.9* 5.1 5.5 8.0 8.0  NEUTROABS 2.5 4.4 5.0 7.3 6.8  HGB 11.8* 11.6* 10.7* 10.9* 10.8*  HCT 32.7* 31.2* 28.8* 28.6* 29.1*  MCV 79.6* 77.6* 78.3* 75.9* 77.6*  PLT 168 190 210 194 191   Basic Metabolic Panel: Recent Labs  Lab 05/16/20 0601 05/17/20 0541 05/18/20 0538 05/19/20 0320 05/20/20 0359  NA 128* 131* 128* 137 137  K 3.6 3.2* 2.8* 3.0* 3.6  CL 95* 96* 95* 101 103  CO2 15* 18* 20* 22 23  GLUCOSE 570* 227* 442* 126* 209*  BUN 93* 94* 106* 103* 99*  CREATININE 5.59* 4.91* 4.97* 4.41* 4.11*  CALCIUM 7.8* 8.1* 7.9* 8.1* 8.0*  MG  --   --   --  2.0 2.2   GFR: Estimated Creatinine Clearance: 20.1 mL/min (A) (by C-G formula based on SCr of 4.11 mg/dL (H)). Liver Function Tests: Recent Labs  Lab 05/16/20 0601 05/17/20 0541 05/18/20 0538 05/19/20 0320 05/20/20 0359  AST 23 26 21 25  37  ALT 15 14 16 15 22   ALKPHOS 74 69 72 74 69  BILITOT 1.0 0.9 0.8 0.7 0.7  PROT 5.9* 5.7* 5.2* 5.3* 5.1*  ALBUMIN 2.3* 2.2* 2.1* 2.2* 2.2*   No results for input(s): LIPASE, AMYLASE in the last 168 hours. No results for input(s): AMMONIA in the last 168 hours. Coagulation Profile: No results for input(s): INR, PROTIME in the last 168 hours. Cardiac Enzymes: No results for input(s): CKTOTAL, CKMB, CKMBINDEX, TROPONINI in the last 168 hours. BNP (last 3 results) No results for input(s): PROBNP in the last 8760 hours. HbA1C: No results for input(s): HGBA1C in the last 72 hours. CBG: Recent Labs  Lab 05/19/20 0807 05/19/20 1129 05/19/20 1652 05/19/20 2125 05/20/20 0748  GLUCAP 74 178* 304* 343* 191*   Lipid Profile: No results for input(s): CHOL, HDL, LDLCALC, TRIG, CHOLHDL, LDLDIRECT in the last 72 hours. Thyroid Function Tests: No results for input(s): TSH, T4TOTAL, FREET4, T3FREE, THYROIDAB in the last 72 hours. Anemia Panel: No results for input(s): VITAMINB12,  FOLATE, FERRITIN, TIBC, IRON, RETICCTPCT in the last 72 hours. Sepsis Labs: No results for input(s): PROCALCITON, LATICACIDVEN in the last 168 hours.  Recent Results (from the past 240 hour(s))  Respiratory Panel by RT PCR (Flu A&B, Covid) - Nasopharyngeal Swab     Status: Abnormal   Collection Time: 06-11-2020  5:14 PM   Specimen: Nasopharyngeal Swab  Result Value Ref Range Status   SARS Coronavirus 2 by RT PCR POSITIVE (A) NEGATIVE Final    Comment: RESULT CALLED TO, READ BACK BY AND VERIFIED WITH: LORRIE LEMONS ON Jun 11, 2020 AT 1851 QSD (NOTE) SARS-CoV-2 target nucleic acids are DETECTED.  SARS-CoV-2 RNA is generally detectable in upper respiratory specimens  during the acute phase of infection. Positive results are indicative of the presence of the identified virus, but do not rule out bacterial infection or co-infection  with other pathogens not detected by the test. Clinical correlation with patient history and other diagnostic information is necessary to determine patient infection status. The expected result is Negative.  Fact Sheet for Patients:  https://www.moore.com/  Fact Sheet for Healthcare Providers: https://www.young.biz/  This test is not yet approved or cleared by the [place] FDA and  has been authorized for detection and/or diagnosis of SARS-CoV-2 by FDA under an Emergency Use Authorization (EUA).  This EUA will remain in effect (meaning this test can b e used) for the duration of  the COVID-19 declaration under Section 564(b)(1) of the Act, 21 U.S.C. section 360bbb-3(b)(1), unless the authorization is terminated or revoked sooner.      Influenza A by PCR NEGATIVE NEGATIVE Final   Influenza B by PCR NEGATIVE NEGATIVE Final    Comment: (NOTE) The Xpert Xpress SARS-CoV-2/FLU/RSV assay is intended as an aid in  the diagnosis of influenza from Nasopharyngeal swab specimens and  should not be used as a sole basis for  treatment. Nasal washings and  aspirates are unacceptable for Xpert Xpress SARS-CoV-2/FLU/RSV  testing.  Fact Sheet for Patients: https://www.moore.com/  Fact Sheet for Healthcare Providers: https://www.young.biz/  This test is not yet approved or cleared by the [place] FDA and  has been authorized for detection and/or diagnosis of SARS-CoV-2 by  FDA under an Emergency Use Authorization (EUA). This EUA will remain  in effect (meaning this test can be used) for the duration of the  Covid-19 declaration under Section 564(b)(1) of the Act, 21  U.S.C. section 360bbb-3(b)(1), unless the authorization is  terminated or revoked. Performed at [facility], [address] [place], [place] [address]   Blood culture (routine x 2)     Status: None   Collection Time: 05/14/2020  5:14 PM   Specimen: BLOOD  Result Value Ref Range Status   Specimen Description BLOOD LEFT ARM  Final   Special Requests   Final    BOTTLES DRAWN AEROBIC AND ANAEROBIC Blood Culture adequate volume   Culture   Final    NO GROWTH 5 DAYS Performed at [facility], [address], [place], [place] [address]    Report Status 05/20/2020 FINAL  Final  Blood culture (routine x 2)     Status: None   Collection Time: 05/04/2020  5:14 PM   Specimen: BLOOD  Result Value Ref Range Status   Specimen Description BLOOD RIGHT ARM  Final   Special Requests   Final    BOTTLES DRAWN AEROBIC AND ANAEROBIC Blood Culture adequate volume   Culture   Final    NO GROWTH 5 DAYS Performed at [facility], [address], [place], [place] [address]    Report Status 05/20/2020 FINAL  Final         Radiology Studies: DG Chest Port 1 View  Result Date: 05/20/2020 CLINICAL DATA:  Shortness of breath, COVID and history of diabetes EXAM: PORTABLE CHEST 1 VIEW COMPARISON:  May 15, 2020 FINDINGS: Heart size and mediastinal contours are stable accounting for  partially obscured LEFT heart border in the setting of worsening LEFT chest opacification since prior study. Interstitial and airspace opacities of also developed in the RIGHT lung since the previous exam. No definite effusion. On limited assessment skeletal structures without acute process. IMPRESSION: Worsening opacification in the setting of LEFT chest and interstitial and airspace opacities in the RIGHT lung, findings compatible with worsening of multifocal pneumonia in the setting of COVID-19 infection. Electronically Signed  By: Donzetta Kohut M.D.   On: 05/20/2020 08:35        Scheduled Meds: . amLODipine  10 mg Oral Daily  . vitamin C  500 mg Oral Daily  . aspirin EC  81 mg Oral Daily  . enoxaparin (LOVENOX) injection  30 mg Subcutaneous Q24H  . famotidine  10 mg Oral Daily  . feeding supplement (NEPRO CARB STEADY)  237 mL Oral TID BM  . fluticasone  2 spray Each Nare Daily  . guaiFENesin  600 mg Oral BID  . hydrALAZINE  50 mg Oral TID  . influenza vac split quadrivalent PF  0.5 mL Intramuscular Tomorrow-1000  . insulin aspart  0-20 Units Subcutaneous TID WC  . insulin aspart  0-5 Units Subcutaneous QHS  . insulin glargine  25 Units Subcutaneous BID  . linagliptin  5 mg Oral Daily  . methylPREDNISolone (SOLU-MEDROL) injection  60 mg Intravenous Q12H  . multivitamin with minerals  1 tablet Oral Daily  . pneumococcal 23 valent vaccine  0.5 mL Intramuscular Tomorrow-1000  . zinc sulfate  220 mg Oral Daily   Continuous Infusions:    LOS: 5 days    Time spent: 42 minutes spent on chart review, discussion with nursing staff, consultants, updating family and interview/physical exam; more than 50% of that time was spent in counseling and/or coordination of care.    Alvira Philips [place], DO [facility] Available via Epic secure chat 7am-7pm After these hours, please refer to coverage provider listed on [email or web address] 05/20/2020, 9:47 AM

## 2020-05-20 NOTE — Progress Notes (Signed)
[facility]  ROUNDING NOTE   Subjective:   Patient resting in bed, in no acute distress.O2 requirement reduced to 15 L via HFNC today. (worse from 8 L)  09/30 0701 - 10/01 0700 In: 1723.8 [P.O.:357; I.V.:1266.8; IV Piggyback:100] Out: 2400 [Urine:2400]  Lab Results  Component Value Date   CREATININE 4.11 (H) 05/20/2020   CREATININE 4.41 (H) 05/19/2020   CREATININE 4.97 (H) 05/18/2020     Objective:  Vital signs in last 24 hours:  Temp:  [97.5 F (36.4 C)-98.8 F (37.1 C)] 97.5 F (36.4 C) (10/01 0749) Pulse Rate:  [70-119] 83 (10/01 0828) Resp:  [15-22] 22 (10/01 0749) BP: (129-167)/(52-88) 148/82 (10/01 0905) SpO2:  [83 %-100 %] 94 % (10/01 0828)  Weight change:  Filed Weights   06-03-20 1707 05/16/20 1539  Weight: 77.1 kg 71.7 kg    Intake/Output: I/O last 3 completed shifts: In: 1723.8 [P.O.:357; I.V.:1266.8; IV Piggyback:100] Out: 3750 [Urine:3750]   Intake/Output this shift:  No intake/output data recorded.  Physical Exam: General: Resting in bed  Head:  High flow nasal canula in place  Eyes: Anicteric  Lungs:  Normal symmetrical respirations, lungs clear  Heart: Regular rate and rhythm  Abdomen:  Nontender, non distended  Extremities: No  peripheral edema.  Neurologic: Alert,oriented  Skin: No acute rashes or  lesions       Basic Metabolic Panel: Recent Labs  Lab 05/16/20 0601 05/16/20 0601 05/17/20 0541 05/17/20 0541 05/18/20 0538 05/19/20 0320 05/20/20 0359  NA 128*  --  131*  --  128* 137 137  K 3.6  --  3.2*  --  2.8* 3.0* 3.6  CL 95*  --  96*  --  95* 101 103  CO2 15*  --  18*  --  20* 22 23  GLUCOSE 570*  --  227*  --  442* 126* 209*  BUN 93*  --  94*  --  106* 103* 99*  CREATININE 5.59*  --  4.91*  --  4.97* 4.41* 4.11*  CALCIUM 7.8*   < > 8.1*   < > 7.9* 8.1* 8.0*  MG  --   --   --   --   --  2.0 2.2   < > = values in this interval not displayed.    Liver Function Tests: Recent Labs  Lab 05/16/20 0601  05/17/20 0541 05/18/20 0538 05/19/20 0320 05/20/20 0359  AST 23 26 21 25  37  ALT 15 14 16 15 22   ALKPHOS 74 69 72 74 69  BILITOT 1.0 0.9 0.8 0.7 0.7  PROT 5.9* 5.7* 5.2* 5.3* 5.1*  ALBUMIN 2.3* 2.2* 2.1* 2.2* 2.2*   No results for input(s): LIPASE, AMYLASE in the last 168 hours. No results for input(s): AMMONIA in the last 168 hours.  CBC: Recent Labs  Lab 05/16/20 0601 05/17/20 0541 05/18/20 0538 05/19/20 0320 05/20/20 0359  WBC 2.9* 5.1 5.5 8.0 8.0  NEUTROABS 2.5 4.4 5.0 7.3 6.8  HGB 11.8* 11.6* 10.7* 10.9* 10.8*  HCT 32.7* 31.2* 28.8* 28.6* 29.1*  MCV 79.6* 77.6* 78.3* 75.9* 77.6*  PLT 168 190 210 194 191    Cardiac Enzymes: No results for input(s): CKTOTAL, CKMB, CKMBINDEX, TROPONINI in the last 168 hours.  BNP: Invalid input(s): POCBNP  CBG: Recent Labs  Lab 05/19/20 0807 05/19/20 1129 05/19/20 1652 05/19/20 2125 05/20/20 0748  GLUCAP 74 178* 304* 343* 191*    Microbiology: Results for orders placed or performed during the hospital encounter of 06-03-20  Respiratory Panel  by RT PCR (Flu A&B, Covid) - Nasopharyngeal Swab     Status: Abnormal   Collection Time: 04/23/2020  5:14 PM   Specimen: Nasopharyngeal Swab  Result Value Ref Range Status   SARS Coronavirus 2 by RT PCR POSITIVE (A) NEGATIVE Final    Comment: RESULT CALLED TO, READ BACK BY AND VERIFIED WITH: LORRIE LEMONS ON 05/01/2020 AT 1851 QSD (NOTE) SARS-CoV-2 target nucleic acids are DETECTED.  SARS-CoV-2 RNA is generally detectable in upper respiratory specimens  during the acute phase of infection. Positive results are indicative of the presence of the identified virus, but do not rule out bacterial infection or co-infection with other pathogens not detected by the test. Clinical correlation with patient history and other diagnostic information is necessary to determine patient infection status. The expected result is Negative.  Fact Sheet for Patients:   https://www.moore.com/  Fact Sheet for Healthcare Providers: https://www.young.biz/  This test is not yet approved or cleared by the [place] FDA and  has been authorized for detection and/or diagnosis of SARS-CoV-2 by FDA under an Emergency Use Authorization (EUA).  This EUA will remain in effect (meaning this test can b e used) for the duration of  the COVID-19 declaration under Section 564(b)(1) of the Act, 21 U.S.C. section 360bbb-3(b)(1), unless the authorization is terminated or revoked sooner.      Influenza A by PCR NEGATIVE NEGATIVE Final   Influenza B by PCR NEGATIVE NEGATIVE Final    Comment: (NOTE) The Xpert Xpress SARS-CoV-2/FLU/RSV assay is intended as an aid in  the diagnosis of influenza from Nasopharyngeal swab specimens and  should not be used as a sole basis for treatment. Nasal washings and  aspirates are unacceptable for Xpert Xpress SARS-CoV-2/FLU/RSV  testing.  Fact Sheet for Patients: https://www.moore.com/  Fact Sheet for Healthcare Providers: https://www.young.biz/  This test is not yet approved or cleared by the [place] FDA and  has been authorized for detection and/or diagnosis of SARS-CoV-2 by  FDA under an Emergency Use Authorization (EUA). This EUA will remain  in effect (meaning this test can be used) for the duration of the  Covid-19 declaration under Section 564(b)(1) of the Act, 21  U.S.C. section 360bbb-3(b)(1), unless the authorization is  terminated or revoked. Performed at [facility], [address] [place], [place] [address]   Blood culture (routine x 2)     Status: None   Collection Time: 05/07/2020  5:14 PM   Specimen: BLOOD  Result Value Ref Range Status   Specimen Description BLOOD LEFT ARM  Final   Special Requests   Final    BOTTLES DRAWN AEROBIC AND ANAEROBIC Blood Culture adequate volume   Culture   Final    NO GROWTH 5  DAYS Performed at [facility], [address] [place], [place] [address]    Report Status 05/20/2020 FINAL  Final  Blood culture (routine x 2)     Status: None   Collection Time: 04/27/2020  5:14 PM   Specimen: BLOOD  Result Value Ref Range Status   Specimen Description BLOOD RIGHT ARM  Final   Special Requests   Final    BOTTLES DRAWN AEROBIC AND ANAEROBIC Blood Culture adequate volume   Culture   Final    NO GROWTH 5 DAYS Performed at [facility], [address] [place], [place] [address]    Report Status 05/20/2020 FINAL  Final    Coagulation Studies: No results for input(s): LABPROT, INR in the last 72 hours.  Urinalysis: No results for input(s): COLORURINE, LABSPEC, PHURINE, GLUCOSEU, HGBUR, BILIRUBINUR, KETONESUR, PROTEINUR, UROBILINOGEN, NITRITE, LEUKOCYTESUR in the last 72 hours.  Invalid input(s): APPERANCEUR    Imaging: DG Chest Port 1 View  Result Date: 05/20/2020 CLINICAL DATA:  Shortness of breath, COVID and history of diabetes EXAM: PORTABLE CHEST 1 VIEW COMPARISON:  Jun 08, 2020 FINDINGS: Heart size and mediastinal contours are stable accounting for partially obscured LEFT heart border in the setting of worsening LEFT chest opacification since prior study. Interstitial and airspace opacities of also developed in the RIGHT lung since the previous exam. No definite effusion. On limited assessment skeletal structures without acute process. IMPRESSION: Worsening opacification in the setting of LEFT chest and interstitial and airspace opacities in the RIGHT lung, findings compatible with worsening of multifocal pneumonia in the setting of COVID-19 infection. Electronically Signed   By: Donzetta Kohut M.D.   On: 05/20/2020 08:35     Medications:     amLODipine  10 mg Oral Daily   vitamin C  500 mg Oral Daily   aspirin EC  81 mg Oral Daily   enoxaparin (LOVENOX) injection  30 mg Subcutaneous Q24H   famotidine  10 mg Oral Daily    feeding supplement (NEPRO CARB STEADY)  237 mL Oral TID BM   fluticasone  2 spray Each Nare Daily   guaiFENesin  600 mg Oral BID   hydrALAZINE  75 mg Oral TID   influenza vac split quadrivalent PF  0.5 mL Intramuscular Tomorrow-1000   insulin aspart  0-20 Units Subcutaneous TID WC   insulin aspart  0-5 Units Subcutaneous QHS   insulin glargine  30 Units Subcutaneous BID   linagliptin  5 mg Oral Daily   methylPREDNISolone (SOLU-MEDROL) injection  60 mg Intravenous Q12H   multivitamin with minerals  1 tablet Oral Daily   pneumococcal 23 valent vaccine  0.5 mL Intramuscular Tomorrow-1000   zinc sulfate  220 mg Oral Daily   acetaminophen, chlorpheniramine-HYDROcodone, guaiFENesin-dextromethorphan, magnesium hydroxide, ondansetron **OR** ondansetron (ZOFRAN) IV, sodium chloride, traZODone  Assessment/ Plan:  Mr. DEMITRI KUCINSKI is a 59 y.o.  male presented from home for shortness of breath and increasing weakness.  Hypoxic on room air as per EMS.  Diagnosed with Covid pneumonia.  He has recurrent falls, nasal congestion.  Lab results showed acidosis, hyponatremia, hypokalemia, increased creatinine, low albumin.h/o diabetes for 20 years, was not compliant with medications for the past one year.  #Acute kidney injury Creatinine improving gradually Continue IV Fluids Electrolytes and Volume status are acceptable No acute indication for Dialysis at present   #Diabetes type 2, poorly controlled Last HbA1C: 13.8 on 05/16/20 On long acting and sliding scale insulin regimen  #Multifocal pneumonia secondary to COVID-19 Patient is on Remdesivir and steroids Required non breather during this admission  on high flow O2     #Hypokalemia K+ improved to 3.6  today,   Continue oral Potassium supplements     LOS: 5 Beckett Hickmon 10/1/202110:07 AM

## 2020-05-20 NOTE — Plan of Care (Addendum)
Per shift report - about 0530 this morning - patient was increased from [facility] to 11LHF.  Just after shift change  - assessment indicate slight crackles bilat and desat to low 80s.  Increased to 15LHF and then also needed NRB.  Dr. Informed with all changes - Orders placed and rounded.  Lasix given, xray completed. Flonase ordered.  Unable to eat with just HF.  Needs to eats a few bites and recover with NRB - then few more bites. When done eating - NRB removed since at 100% SPO2.  Now only  on 15HF - sats mid-highs 80s.  Current goal 85%.  Dr  And charge Aware.  Will continue to monitor.  1025 Assisted to [facility], but desat to low 70s and extreme SOB just on HF15L.  NRB placed as well to aid in recovery - took about 30 minutes to stabilize at mid 80s.  Will keep in place for now and remove NRB again once sats higher and more stable.   1155 - Took about 1.5hrs to stabilize from [facility] (was on 15L HF and NRB). Now about 97%, removing NRB. Not SOB or in distress, stable. Goal 85% on HF 15L.

## 2020-05-20 NOTE — Discharge Planning (Addendum)
Educated on insulin administration and explained what patient is to expect once he goes home.  Explained sliding scale and assisted patient as he gave his own shot.  Patient states he will also have family support, living in his home to assist with recovery.  Educated or incentive spirometer use and showed understanding with teachback.

## 2020-05-20 NOTE — Progress Notes (Addendum)
Inpatient Diabetes Program Recommendations  AACE/ADA: New Consensus Statement on Inpatient Glycemic Control (2015)  Target Ranges:  Prepandial:   less than 140 mg/dL      Peak postprandial:   less than 180 mg/dL (1-2 hours)      Critically ill patients:  140 - 180 mg/dL   Results for Jackson Stark, Jackson Stark (MRN [id number]) as of 05/20/2020 07:28  Ref. Range 05/18/2020 07:40 05/18/2020 11:49 05/18/2020 16:39 05/18/2020 20:00  Glucose-Capillary Latest Ref Range: 70 - 99 mg/dL 407 (H)  20 units NOVOLOG +  25 units LANTUS @8 :48am  460 (H)  20 units NOVOLOG  440 (H)  25 units NOVOLOG  467 (H)  22 units NOVOLOG   LANTUS HELD    Results for Jackson Stark, Jackson Stark (MRN Madaline Brilliant) as of 05/20/2020 07:28  Ref. Range 05/19/2020 07:52 05/19/2020 08:07 05/19/2020 11:29 05/19/2020 16:52 05/19/2020 21:25  Glucose-Capillary Latest Ref Range: 70 - 99 mg/dL 64 (L) 74    25 units LANTUS @9 :42am 178 (H)  4 units NOVOLOG  304 (H)  15 units NOVOLOG  343 (H)    25 units LANTUS @9 :53pm   Results for Jackson Stark, Jackson Stark (MRN ) as of 05/20/2020 08:16  Ref. Range 05/20/2020 07:48  Glucose-Capillary Latest Ref Range: 70 - 99 mg/dL [id number] (H)   Results for Jackson Stark, Jackson Stark (MRN 07/20/2020) as of 05/20/2020 07:28  Ref. Range 05/16/2020 06:01  Hemoglobin A1C Latest Ref Range: 4.8 - 5.6 % 13.8 (H)  (349 mg/dl)    Home DM Meds: NONE  Current Insulin Orders: Lantus 25 units BID         Novolog Resistant Correction Scale/ SSI (0-20 units) TID AC + HS        Tradjenta 5 mg Daily     Note Solumedrol stopped--Last dose given 8:50am on 09/29--Now getting Prednisone 50 mg Daily.  Creatinine slowly improving daily--Unknown baseline kidney function per MD notes   MD- CBG down to 191 this AM.  Please consider the following in-hospital insulin adjustments:  Start Novolog Meal Coverage: Novolog 6 units TID with meals (while pt remains on Prednisone)   --Will follow patient during  hospitalization--  07/20/2020 RN, MSN, CDE Diabetes Coordinator Inpatient Glycemic Control Team Team Pager: [phone] (8a-5p)

## 2020-05-20 NOTE — Consult Note (Signed)
PHARMACY CONSULT NOTE - FOLLOW UP  Pharmacy Consult for Electrolyte Monitoring and Replacement   Recent Labs: Potassium (mmol/L)  Date Value  05/20/2020 3.6   Magnesium (mg/dL)  Date Value  00/86/7619 2.2   Calcium (mg/dL)  Date Value  50/93/2671 8.0 (L)   Albumin (g/dL)  Date Value  24/58/0998 2.2 (L)   Sodium (mmol/L)  Date Value  05/20/2020 137     Assessment: NathanTidwellis a58 y.o.malewith a known history of COPD, hypertension and type 2 diabetes mellitus, who presented to the emergency room with acute onset of worsening dyspnea/COVID PNA and hyperglycemia.  BG currently 191 on prednisone, SSI, and glargine 25 units bid.  Pharmacy has been consulted to monitor/replenish K. 10/01 K 3.6  Goal of Therapy:  K ~ 4  Plan:  MD has ordered KCl PO x 1 dose.   Will follow up on AM labs.  Albina Billet, PharmD, BCPS Clinical Pharmacist 05/20/2020 7:55 AM]

## 2020-05-20 NOTE — Consult Note (Signed)
ANTICOAGULATION CONSULT NOTE  Pharmacy Consult for Enoxaparin treatment dosing Indication: Concern for PE/DVT w/ rising ddimer and unable to perform CT/NM VQ scan  Not on File  Patient Measurements: Height: 5\' 10"  (177.8 cm) Weight: 71.7 kg (158 lb 1.1 oz) (bed scale) IBW/kg (Calculated) : 73  Vital Signs: Temp: 97.5 F (36.4 C) (10/01 0749) Temp Source: Oral (10/01 0749) BP: 148/82 (10/01 0905) Pulse Rate: 83 (10/01 0828)  Labs: Recent Labs    05/18/20 0538 05/18/20 0538 05/19/20 0320 05/20/20 0359  HGB 10.7*   < > 10.9* 10.8*  HCT 28.8*  --  28.6* 29.1*  PLT 210  --  194 191  CREATININE 4.97*  --  4.41* 4.11*   < > = values in this interval not displayed.    Estimated Creatinine Clearance: 20.1 mL/min (A) (by C-G formula based on SCr of 4.11 mg/dL (H)).   Medications:  No PTA anticoagulation of record.  Pt has been on Enoxaparin 30mg  - last dose 0930 2153  Assessment: 59 yo male with COVID PNA and rising D-dimer and AKI. Scr since admission     Pharmacy has been consulted to initiate therapeutic enoxaparin.  Scr since admission 5.78----->4.11 (unclear baseline)   HGB currently stable @ 10.8  Goal of Therapy:   Monitor platelets by anticoagulation protocol: Yes   Plan:  Lovenox 1mg /kg q24  Will monitor CBC and Scr daily while on therapeutic enoxaparin.   2154, PharmD, BCPS Clinical Pharmacist 05/20/2020 10:44 AM

## 2020-05-20 DEATH — deceased

## 2020-05-21 DIAGNOSIS — N179 Acute kidney failure, unspecified: Secondary | ICD-10-CM | POA: Diagnosis not present

## 2020-05-21 DIAGNOSIS — J9601 Acute respiratory failure with hypoxia: Secondary | ICD-10-CM | POA: Diagnosis not present

## 2020-05-21 DIAGNOSIS — U071 COVID-19: Secondary | ICD-10-CM | POA: Diagnosis not present

## 2020-05-21 DIAGNOSIS — E1365 Other specified diabetes mellitus with hyperglycemia: Secondary | ICD-10-CM | POA: Diagnosis not present

## 2020-05-21 LAB — C-REACTIVE PROTEIN: CRP: 7.4 mg/dL — ABNORMAL HIGH (ref <1.0)

## 2020-05-21 LAB — GLUCOSE, CAPILLARY
Glucose-Capillary: 278 mg/dL — ABNORMAL HIGH (ref 70–99)
Glucose-Capillary: 378 mg/dL — ABNORMAL HIGH (ref 70–99)
Glucose-Capillary: 287 mg/dL — ABNORMAL HIGH (ref 70–99)
Glucose-Capillary: 246 mg/dL — ABNORMAL HIGH (ref 70–99)

## 2020-05-21 LAB — CBC
HCT: 28.4 % — ABNORMAL LOW (ref 39.0–52.0)
Hemoglobin: 10.5 g/dL — ABNORMAL LOW (ref 13.0–17.0)
MCH: 28.8 pg (ref 26.0–34.0)
MCHC: 37 g/dL — ABNORMAL HIGH (ref 30.0–36.0)
MCV: 77.8 fL — ABNORMAL LOW (ref 80.0–100.0)
Platelets: 225 10*3/uL (ref 150–400)
RBC: 3.65 MIL/uL — ABNORMAL LOW (ref 4.22–5.81)
RDW: 12.8 % (ref 11.5–15.5)
WBC: 6.8 10*3/uL (ref 4.0–10.5)
nRBC: 0 % (ref 0.0–0.2)

## 2020-05-21 LAB — BASIC METABOLIC PANEL
Anion gap: 12 (ref 5–15)
BUN: 97 mg/dL — ABNORMAL HIGH (ref 6–20)
CO2: 22 mmol/L (ref 22–32)
Calcium: 8.3 mg/dL — ABNORMAL LOW (ref 8.9–10.3)
Chloride: 98 mmol/L (ref 98–111)
Creatinine, Ser: 4.15 mg/dL — ABNORMAL HIGH (ref 0.61–1.24)
GFR calc Af Amer: 17 mL/min — ABNORMAL LOW (ref >60)
GFR calc non Af Amer: 15 mL/min — ABNORMAL LOW (ref >60)
Glucose, Bld: 311 mg/dL — ABNORMAL HIGH (ref 70–99)
Potassium: 3.8 mmol/L (ref 3.5–5.1)
Sodium: 132 mmol/L — ABNORMAL LOW (ref 135–145)

## 2020-05-21 LAB — FIBRIN DERIVATIVES D-DIMER (ARMC ONLY): Fibrin derivatives D-dimer (ARMC): 2049.04 ng/mL (FEU) — ABNORMAL HIGH (ref 0.00–499.00)

## 2020-05-21 LAB — MAGNESIUM: Magnesium: 2.3 mg/dL (ref 1.7–2.4)

## 2020-05-21 MED ORDER — INSULIN ASPART 100 UNIT/ML ~~LOC~~ SOLN
5.0000 [IU] | Freq: Three times a day (TID) | SUBCUTANEOUS | Status: DC
  Administered 2020-05-21 (×3): 5 [IU] via SUBCUTANEOUS
  Filled 2020-05-21 (×3): qty 1

## 2020-05-21 MED ORDER — INSULIN GLARGINE 100 UNIT/ML ~~LOC~~ SOLN
35.0000 [IU] | Freq: Two times a day (BID) | SUBCUTANEOUS | Status: DC
  Administered 2020-05-21 (×2): 35 [IU] via SUBCUTANEOUS
  Filled 2020-05-21 (×3): qty 0.35

## 2020-05-21 MED ORDER — HYDRALAZINE HCL 50 MG PO TABS
100.0000 mg | ORAL_TABLET | Freq: Three times a day (TID) | ORAL | Status: DC
  Administered 2020-05-21 – 2020-05-24 (×11): 100 mg via ORAL
  Filled 2020-05-21 (×11): qty 2

## 2020-05-21 MED ORDER — FENTANYL CITRATE (PF) 100 MCG/2ML IJ SOLN
25.0000 ug | Freq: Once | INTRAMUSCULAR | Status: AC
  Administered 2020-05-21: 25 ug via INTRAVENOUS
  Filled 2020-05-21: qty 2

## 2020-05-21 NOTE — Progress Notes (Signed)
PROGRESS NOTE    Jackson Stark  LKG:[id number] DOB: [birth year] DOA: 04/21/2020 PCP: Patient, No Pcp Per    Brief Narrative:  Jackson Stark is a 59 year old male with past medical history notable for COPD, essential hypertension, type 2 diabetes mellitus who presented to the emergency department with progressive shortness of breath associated with dry cough and generalized weakness.  Also with recurrent falls.  In the ED, patient was noted to be hypoxic with SPO2 80% on room air with improvement to 93% on 10 L NRB.  Creatinine 5.78, anion gap 18, BNP 80.5.  Chest x-ray notable for left basal infiltrate.  Patient was started on IV Decadron and doxycycline and given 2 L bolus of normal saline.  TRH consulted for admission.   Assessment & Plan:   Active Problems:   Acute hypoxemic respiratory failure due to COVID-19 [facility])   Malnutrition of moderate degree   AKI (acute kidney injury) (HCC)   Generalized weakness   DM (diabetes mellitus), secondary uncontrolled (HCC)   Acute respiratory failure with hypoxia (HCC)   Acute hypoxic respiratory failure secondary to acute Covid-19 viral pneumonia during the ongoing 2020 Covid 19 Pandemic - POA Patient presenting to ED with progressive shortness of breath.  Was found to be hypoxic with SPO2 80% on room air on arrival.  Chest x-ray consistent with Covid-19 viral pneumonia; with repeat x-ray on 05/20/2020 showing worsening progression of multifocal pneumonia. --COVID test: + 05/12/2020 --CRP 8.7>6.2>4.5>3.0>2.6> labs pending this morning --ddimer 1,079>905>925>1475>2118>2049 --Completed 5-day course of remdesivir on 05/19/2020 --Unable to start baricitinib 2/2 renal insufficiency/acute renal failure --Continue Solu-Medrol 60 mg IV q12h --prone for 2-3hrs every 12hrs if able --Continue supplemental oxygen, titrate to maintain SPO2 greater than 85%, on 9L HFNC --Continue supportive care with albuterol MDI prn, vitamin C, zinc, Tylenol,  antitussives (benzonatate/ Mucinex/Tussionex) --Follow CBC, CMP, D-dimer, ferritin, and CRP daily --Continue airborne/contact isolation precautions for 3 weeks from the day of diagnosis  The treatment plan and use of medications and known side effects were discussed with patient/family. Some of the medications used are based on case reports/anecdotal data.  All other medications being used in the management of COVID-19 based on limited study data.  Complete risks and long-term side effects are unknown, however in the best clinical judgment they seem to be of some benefit.  Patient wanted to proceed with treatment options provided.  Elevated D-dimer D-dimer now trending up despite treatment for Covid-19, 905>925>1475>2118.  Concern of blood clot whether DVT versus PE given known hypercoagulable state with Covid-19 infection coupled with poor mobility.  Unable to obtain CT angiogram chest due to renal failure.  Unable to obtain nuclear medicine VQ scan due to patient's inability to lay flat for 20 minutes with resultant worsening of his hypoxia.  Venous duplex vascular ultrasound bilateral lower extremities negative for DVT. --Started treatment dose Lovenox 10/1 --Continue to follow D-dimer daily  Acute renal failure Unknown baseline, poor outpatient follow-up/medical noncompliance.  Presented with creatinine 5.59.  Renal ultrasound with no acute abnormality. --Nephrology following, appreciate assistance --Cr 5.59>4.41>4.11>4.15 --Discontinued IV fluids on 10/1 due to worsening hypoxemia --Avoid nephrotoxins, renally dose all medications --Close monitoring of urinary output --Follow BMP daily  Hypokalemia Potassium 3.8 today --Repeat electrolytes in a.m. to include magnesium  Type 2 diabetes mellitus Hemoglobin A1c 13.8, poorly controlled. --Tradjenta 5 mg p.o. daily --Increase Lantus to 35 units [place] BID --Start NovoLog 5 units TID AC --Resistant insulin sliding scale for coverage --CBGs  qAC/HS  Essential hypertension BP  168/85 this morning. (range 126/84 - 173/76 past 24h) --Started amlodipine 10 mg p.o. daily --Increase hydralazine to 100 mg p.o. TID --Continue monitor BP closely and adjust as needed  Tobacco use disorder Counseled.   DVT prophylaxis: Lovenox Code Status: Full code Family Communication: Updated patient extensively at bedside  Disposition Plan:  Status is: Inpatient  Remains inpatient appropriate because:Persistent severe electrolyte disturbances, Ongoing diagnostic testing needed not appropriate for outpatient work up, Unsafe d/c plan, IV treatments appropriate due to intensity of illness or inability to take PO and Inpatient level of care appropriate due to severity of illness   Dispo: The patient is from: Home              Anticipated d/c is to: Home              Anticipated d/c date is: > 3 days              Patient currently is not medically stable to d/c.  Continues with high FiO2 requirements   Consultants:   Nephrology  Procedures:   Renal ultrasound  Vascular duplex venous ultrasound bilateral lower extremities  Antimicrobials:   None   Subjective: Patient seen and examined bedside, resting comfortably.  Oxygen requirement has decreased since yesterday, was on 15 L HFNC/15LNRB and now down to 9L HFNC.  Continues with significant exertional dyspnea, but reports minimal symptoms at rest.  Continues with nasal congestion. Patient denies any other issues or concerns at this time.  Denies headache, no visual changes, no chest pain, no dizziness, no palpitations, no fever/chills/night sweats, no nausea/vomiting/diarrhea, no fatigue, no abdominal pain, no weakness.  No acute concerns per nursing staff at this time.   Objective: Vitals:   05/20/20 1627 05/20/20 2046 05/21/20 0544 05/21/20 0729  BP: 126/84 (!) 162/88 (!) 168/85 (!) 173/76  Pulse: (!) 107 (!) 105 96 (!) 107  Resp: 16 18 16 19   Temp: 97.7 F (36.5 C) 97.6 F  (36.4 C) 97.8 F (36.6 C) 98 F (36.7 C)  TempSrc: Oral Oral Oral Oral  SpO2: 92% 91% 95% 100%  Weight:      Height:        Intake/Output Summary (Last 24 hours) at 05/21/2020 0953 Last data filed at 05/21/2020 07/21/2020 Gross per 24 hour  Intake 237 ml  Output 1250 ml  Net -1013 ml   Filed Weights   04/30/2020 1707 05/16/20 1539  Weight: 77.1 kg 71.7 kg    Examination:  General exam: Appears calm and comfortable, chronically ill in appearance Respiratory system: Decreased breath sounds bilateral bases, no wheezing/rhonchi, normal respiratory effort without accessory muscle use, on 9L high flow nasal cannula Cardiovascular system: S1 & S2 heard, RRR. No JVD, murmurs, rubs, gallops or clicks. No pedal edema. Gastrointestinal system: Abdomen is nondistended, soft and nontender. No organomegaly or masses felt. Normal bowel sounds heard. Central nervous system: Alert and oriented. No focal neurological deficits. Extremities: Symmetric 5 x 5 power. Skin: No rashes, lesions or ulcers Psychiatry: Judgement and insight appear poor. Mood & affect appropriate.     Data Reviewed: I have personally reviewed following labs and imaging studies  CBC: Recent Labs  Lab 05/16/20 0601 05/16/20 0601 05/17/20 0541 05/18/20 0538 05/19/20 0320 05/20/20 0359 05/21/20 0540  WBC 2.9*   < > 5.1 5.5 8.0 8.0 6.8  NEUTROABS 2.5  --  4.4 5.0 7.3 6.8  --   HGB 11.8*   < > 11.6* 10.7* 10.9* 10.8* 10.5*  HCT 32.7*   < >  31.2* 28.8* 28.6* 29.1* 28.4*  MCV 79.6*   < > 77.6* 78.3* 75.9* 77.6* 77.8*  PLT 168   < > 190 210 194 191 225   < > = values in this interval not displayed.   Basic Metabolic Panel: Recent Labs  Lab 05/17/20 0541 05/18/20 0538 05/19/20 0320 05/20/20 0359 05/21/20 0540  NA 131* 128* 137 137 132*  K 3.2* 2.8* 3.0* 3.6 3.8  CL 96* 95* 101 103 98  CO2 18* 20* 22 23 22   GLUCOSE 227* 442* 126* 209* 311*  BUN 94* 106* 103* 99* 97*  CREATININE 4.91* 4.97* 4.41* 4.11* 4.15*    CALCIUM 8.1* 7.9* 8.1* 8.0* 8.3*  MG  --   --  2.0 2.2 2.3   GFR: Estimated Creatinine Clearance: 19.9 mL/min (A) (by C-G formula based on SCr of 4.15 mg/dL (H)). Liver Function Tests: Recent Labs  Lab 05/16/20 0601 05/17/20 0541 05/18/20 0538 05/19/20 0320 05/20/20 0359  AST 23 26 21 25  37  ALT 15 14 16 15 22   ALKPHOS 74 69 72 74 69  BILITOT 1.0 0.9 0.8 0.7 0.7  PROT 5.9* 5.7* 5.2* 5.3* 5.1*  ALBUMIN 2.3* 2.2* 2.1* 2.2* 2.2*   No results for input(s): LIPASE, AMYLASE in the last 168 hours. No results for input(s): AMMONIA in the last 168 hours. Coagulation Profile: No results for input(s): INR, PROTIME in the last 168 hours. Cardiac Enzymes: No results for input(s): CKTOTAL, CKMB, CKMBINDEX, TROPONINI in the last 168 hours. BNP (last 3 results) No results for input(s): PROBNP in the last 8760 hours. HbA1C: No results for input(s): HGBA1C in the last 72 hours. CBG: Recent Labs  Lab 05/20/20 0748 05/20/20 1222 05/20/20 1626 05/20/20 2045 05/21/20 0723  GLUCAP 191* 208* 300* 354* 278*   Lipid Profile: No results for input(s): CHOL, HDL, LDLCALC, TRIG, CHOLHDL, LDLDIRECT in the last 72 hours. Thyroid Function Tests: No results for input(s): TSH, T4TOTAL, FREET4, T3FREE, THYROIDAB in the last 72 hours. Anemia Panel: No results for input(s): VITAMINB12, FOLATE, FERRITIN, TIBC, IRON, RETICCTPCT in the last 72 hours. Sepsis Labs: No results for input(s): PROCALCITON, LATICACIDVEN in the last 168 hours.  Recent Results (from the past 240 hour(s))  Respiratory Panel by RT PCR (Flu A&B, Covid) - Nasopharyngeal Swab     Status: Abnormal   Collection Time: 09-06-19  5:14 PM   Specimen: Nasopharyngeal Swab  Result Value Ref Range Status   SARS Coronavirus 2 by RT PCR POSITIVE (A) NEGATIVE Final    Comment: RESULT CALLED TO, READ BACK BY AND VERIFIED WITH: LORRIE LEMONS ON 06/03/20 AT 1851 QSD (NOTE) SARS-CoV-2 target nucleic acids are DETECTED.  SARS-CoV-2 RNA is  generally detectable in upper respiratory specimens  during the acute phase of infection. Positive results are indicative of the presence of the identified virus, but do not rule out bacterial infection or co-infection with other pathogens not detected by the test. Clinical correlation with patient history and other diagnostic information is necessary to determine patient infection status. The expected result is Negative.  Fact Sheet for Patients:  https://www.moore.com/https://www.fda.gov/media/142436/download  Fact Sheet for Healthcare Providers: https://www.young.biz/https://www.fda.gov/media/142435/download  This test is not yet approved or cleared by the [place] FDA and  has been authorized for detection and/or diagnosis of SARS-CoV-2 by FDA under an Emergency Use Authorization (EUA).  This EUA will remain in effect (meaning this test can b e used) for the duration of  the COVID-19 declaration under Section 564(b)(1) of the Act, 21 U.S.C. section 360bbb-3(b)(1),  unless the authorization is terminated or revoked sooner.      Influenza A by PCR NEGATIVE NEGATIVE Final   Influenza B by PCR NEGATIVE NEGATIVE Final    Comment: (NOTE) The Xpert Xpress SARS-CoV-2/FLU/RSV assay is intended as an aid in  the diagnosis of influenza from Nasopharyngeal swab specimens and  should not be used as a sole basis for treatment. Nasal washings and  aspirates are unacceptable for Xpert Xpress SARS-CoV-2/FLU/RSV  testing.  Fact Sheet for Patients: https://www.moore.com/  Fact Sheet for Healthcare Providers: https://www.young.biz/  This test is not yet approved or cleared by the [place] FDA and  has been authorized for detection and/or diagnosis of SARS-CoV-2 by  FDA under an Emergency Use Authorization (EUA). This EUA will remain  in effect (meaning this test can be used) for the duration of the  Covid-19 declaration under Section 564(b)(1) of the Act, 21  U.S.C. section  360bbb-3(b)(1), unless the authorization is  terminated or revoked. Performed at [facility], [address] [place], [place] [address]   Blood culture (routine x 2)     Status: None   Collection Time: 06/09/2020  5:14 PM   Specimen: BLOOD  Result Value Ref Range Status   Specimen Description BLOOD LEFT ARM  Final   Special Requests   Final    BOTTLES DRAWN AEROBIC AND ANAEROBIC Blood Culture adequate volume   Culture   Final    NO GROWTH 5 DAYS Performed at [facility], [address], [place], [place] [address]    Report Status 05/20/2020 FINAL  Final  Blood culture (routine x 2)     Status: None   Collection Time: 06-09-20  5:14 PM   Specimen: BLOOD  Result Value Ref Range Status   Specimen Description BLOOD RIGHT ARM  Final   Special Requests   Final    BOTTLES DRAWN AEROBIC AND ANAEROBIC Blood Culture adequate volume   Culture   Final    NO GROWTH 5 DAYS Performed at [facility], [address], [place], [place] [address]    Report Status 05/20/2020 FINAL  Final         Radiology Studies: DG Chest Port 1 View  Result Date: 05/20/2020 CLINICAL DATA:  Shortness of breath, COVID and history of diabetes EXAM: PORTABLE CHEST 1 VIEW COMPARISON:  06/09/2020 FINDINGS: Heart size and mediastinal contours are stable accounting for partially obscured LEFT heart border in the setting of worsening LEFT chest opacification since prior study. Interstitial and airspace opacities of also developed in the RIGHT lung since the previous exam. No definite effusion. On limited assessment skeletal structures without acute process. IMPRESSION: Worsening opacification in the setting of LEFT chest and interstitial and airspace opacities in the RIGHT lung, findings compatible with worsening of multifocal pneumonia in the setting of COVID-19 infection. Electronically Signed   By: Donzetta Kohut M.D.   On: 05/20/2020 08:35        Scheduled Meds:   amLODipine  10 mg Oral Daily   vitamin C  500 mg Oral Daily   aspirin EC  81 mg Oral Daily   enoxaparin (LOVENOX) injection  1 mg/kg Subcutaneous Q24H   famotidine  10 mg Oral Daily   feeding supplement (NEPRO CARB STEADY)  237 mL Oral TID BM   fluticasone  2 spray Each Nare Daily   guaiFENesin  600 mg Oral BID   hydrALAZINE  100 mg Oral TID   influenza vac split quadrivalent PF  0.5 mL Intramuscular Tomorrow-1000   insulin aspart  0-20 Units Subcutaneous TID WC   insulin aspart  0-5 Units Subcutaneous QHS   insulin aspart  5 Units Subcutaneous TID WC   insulin glargine  35 Units Subcutaneous BID   linagliptin  5 mg Oral Daily   methylPREDNISolone (SOLU-MEDROL) injection  60 mg Intravenous Q12H   multivitamin with minerals  1 tablet Oral Daily   pneumococcal 23 valent vaccine  0.5 mL Intramuscular Tomorrow-1000   zinc sulfate  220 mg Oral Daily   Continuous Infusions:    LOS: 6 days    Time spent: 38 minutes spent on chart review, discussion with nursing staff, consultants, updating family and interview/physical exam; more than 50% of that time was spent in counseling and/or coordination of care.    Alvira Philips [place], DO [facility] Available via Epic secure chat 7am-7pm After these hours, please refer to coverage provider listed on [email or web address] 05/21/2020, 9:53 AM

## 2020-05-21 NOTE — Progress Notes (Signed)
   05/21/20 1335  Vitals  Temp 97.9 F (36.6 C)  Temp Source Oral  BP (!) 160/68  MAP (mmHg) 95  BP Location Right Arm  BP Method Automatic  Patient Position (if appropriate) Lying  Pulse Rate (!) 105  Pulse Rate Source Monitor  Resp 19  Level of Consciousness  Level of Consciousness Alert  MEWS COLOR  MEWS Score Color Green  Oxygen Therapy  SpO2 (!) 84 %  O2 Device HFNC  O2 Flow Rate (L/min) 11 L/min  MEWS Score  MEWS Temp 0  MEWS Systolic 0  MEWS Pulse 1  MEWS RR 0  MEWS LOC 0  MEWS Score 1  pt having a nose bleed from his left nares, thick bloody drainage; packed left nares with gauze; instructed by Respiratory Therapy via telephone call to put the cannula in his mouth; sent secure chat to Dr [place]

## 2020-05-21 NOTE — Consult Note (Signed)
PHARMACY CONSULT NOTE - FOLLOW UP  Pharmacy Consult for Electrolyte Monitoring and Replacement   Recent Labs: Potassium (mmol/L)  Date Value  05/21/2020 3.8   Magnesium (mg/dL)  Date Value  16/38/4536 2.3   Calcium (mg/dL)  Date Value  46/80/3212 8.3 (L)   Albumin (g/dL)  Date Value  24/82/5003 2.2 (L)   Sodium (mmol/L)  Date Value  05/21/2020 132 (L)     Assessment: NathanTidwellis a58 y.o.malewith a known history of COPD, hypertension and type 2 diabetes mellitus, who presented to the emergency room with acute onset of worsening dyspnea/COVID PNA and hyperglycemia.  BG currently 191 on prednisone, SSI, and glargine 25 units bid.  Pharmacy has been consulted to monitor/replenish K. 10/01 K 3.6  Goal of Therapy:  K ~ 4  Plan:  K 3.8    Scr 4.15 . No replacement at this time Will follow up on AM labs.  Angelique Blonder, PharmD, BCPS Clinical Pharmacist 05/21/2020 10:12 AM]

## 2020-05-21 NOTE — Progress Notes (Addendum)
   05/21/20 1137  Assess: MEWS Score  BP (!) 165/74  Pulse Rate (!) 104  Resp (!) 21  Level of Consciousness Alert  SpO2 (!) 86 %  O2 Device HFNC  O2 Flow Rate (L/min) 11 L/min  Assess: MEWS Score  MEWS Temp 0  MEWS Systolic 0  MEWS Pulse 1  MEWS RR 1  MEWS LOC 0  MEWS Score 2  MEWS Score Color Yellow  Assess: if the MEWS score is Yellow or Red  Were vital signs taken at a resting state? Yes  Focused Assessment No change from prior assessment  Early Detection of Sepsis Score *See Row Information* Low  MEWS guidelines implemented *See Row Information* Yes  Treat  MEWS Interventions Other (Comment) (pt repositioned in bed; O2 increased to 11L HFNC)  Pain Scale 0-10  Pain Score 0  Take Vital Signs  Increase Vital Sign Frequency  Yellow: Q 2hr X 2 then Q 4hr X 2, if remains yellow, continue Q 4hrs  Escalate  MEWS: Escalate Yellow: discuss with charge nurse/RN and consider discussing with provider and RRT  Notify: Charge Nurse/RN  Name of Charge Nurse/RN Notified Self  Date Charge Nurse/RN Notified 05/21/20  Time Charge Nurse/RN Notified 1137  Repositioned pt in the bed; pulled the mattress up in the bed frame in order for the pt to be higher up in the bed too; increased pt's O2 HFNC from 9 to 11 Liters; verbally instructed pt to TCDB and use the IS and flutter valve that are on his bedside table and in reach of him; pt verbalized that the flutter valve does not work; educated pt to do one of the other, by not doing either one it will not help his respiratory effort; pt's BP Dr [place] previously increased pt's Hydralazine from 75 mg (given at [phone]) to 100mg  PO TID

## 2020-05-21 NOTE — Progress Notes (Signed)
Spoke with Shella Maxim via telephone call; gave current update of pt's status

## 2020-05-21 NOTE — Progress Notes (Signed)
[facility]  ROUNDING NOTE   Subjective:   Pt seen and evaluated at bedside.  UOP 1.1 liters over past 24 hours.  Na slightly low at 132.   10/01 0701 - 10/02 0700 In: -  Out: 1150 [Urine:1150]  Lab Results  Component Value Date   CREATININE 4.15 (H) 05/21/2020   CREATININE 4.11 (H) 05/20/2020   CREATININE 4.41 (H) 05/19/2020     Objective:  Vital signs in last 24 hours:  Temp:  [97.6 F (36.4 C)-98 F (36.7 C)] 97.9 F (36.6 C) (10/02 1335) Pulse Rate:  [96-107] 105 (10/02 1335) Resp:  [16-21] 19 (10/02 1335) BP: (126-173)/(68-88) 160/68 (10/02 1335) SpO2:  [84 %-100 %] 84 % (10/02 1335)  Weight change:  Filed Weights   2020-05-22 1707 05/16/20 1539  Weight: 77.1 kg 71.7 kg    Intake/Output: I/O last 3 completed shifts: In: -  Out: 2350 [Urine:2350]   Intake/Output this shift:  Total I/O In: 237 [P.O.:237] Out: 500 [Urine:500]  Physical Exam: General: Resting in bed  Head: High flow nasal canula in place  Eyes: Anicteric  Lungs:  Normal symmetrical respirations, lungs clear  Heart: Regular rate and rhythm  Abdomen:  Nontender, non distended  Extremities: No  peripheral edema.  Neurologic: Alert, oriented  Skin: No acute rashes or  lesions       Basic Metabolic Panel: Recent Labs  Lab 05/17/20 0541 05/17/20 0541 05/18/20 0538 05/18/20 0538 05/19/20 0320 05/20/20 0359 05/21/20 0540  NA 131*  --  128*  --  137 137 132*  K 3.2*  --  2.8*  --  3.0* 3.6 3.8  CL 96*  --  95*  --  101 103 98  CO2 18*  --  20*  --  22 23 22   GLUCOSE 227*  --  442*  --  126* 209* 311*  BUN 94*  --  106*  --  103* 99* 97*  CREATININE 4.91*  --  4.97*  --  4.41* 4.11* 4.15*  CALCIUM 8.1*   < > 7.9*   < > 8.1* 8.0* 8.3*  MG  --   --   --   --  2.0 2.2 2.3   < > = values in this interval not displayed.    Liver Function Tests: Recent Labs  Lab 05/16/20 0601 05/17/20 0541 05/18/20 0538 05/19/20 0320 05/20/20 0359  AST 23 26 21 25  37  ALT 15 14  16 15 22   ALKPHOS 74 69 72 74 69  BILITOT 1.0 0.9 0.8 0.7 0.7  PROT 5.9* 5.7* 5.2* 5.3* 5.1*  ALBUMIN 2.3* 2.2* 2.1* 2.2* 2.2*   No results for input(s): LIPASE, AMYLASE in the last 168 hours. No results for input(s): AMMONIA in the last 168 hours.  CBC: Recent Labs  Lab 05/16/20 0601 05/16/20 0601 05/17/20 0541 05/18/20 0538 05/19/20 0320 05/20/20 0359 05/21/20 0540  WBC 2.9*   < > 5.1 5.5 8.0 8.0 6.8  NEUTROABS 2.5  --  4.4 5.0 7.3 6.8  --   HGB 11.8*   < > 11.6* 10.7* 10.9* 10.8* 10.5*  HCT 32.7*   < > 31.2* 28.8* 28.6* 29.1* 28.4*  MCV 79.6*   < > 77.6* 78.3* 75.9* 77.6* 77.8*  PLT 168   < > 190 210 194 191 225   < > = values in this interval not displayed.    Cardiac Enzymes: No results for input(s): CKTOTAL, CKMB, CKMBINDEX, TROPONINI in the last 168 hours.  BNP: Invalid input(s):  POCBNP  CBG: Recent Labs  Lab 05/20/20 1222 05/20/20 1626 05/20/20 2045 05/21/20 0723 05/21/20 1101  GLUCAP 208* 300* 354* 278* 378*    Microbiology: Results for orders placed or performed during the hospital encounter of 05/05/2020  Respiratory Panel by RT PCR (Flu A&B, Covid) - Nasopharyngeal Swab     Status: Abnormal   Collection Time: 05/14/2020  5:14 PM   Specimen: Nasopharyngeal Swab  Result Value Ref Range Status   SARS Coronavirus 2 by RT PCR POSITIVE (A) NEGATIVE Final    Comment: RESULT CALLED TO, READ BACK BY AND VERIFIED WITH: LORRIE LEMONS ON 05/08/2020 AT 1851 QSD (NOTE) SARS-CoV-2 target nucleic acids are DETECTED.  SARS-CoV-2 RNA is generally detectable in upper respiratory specimens  during the acute phase of infection. Positive results are indicative of the presence of the identified virus, but do not rule out bacterial infection or co-infection with other pathogens not detected by the test. Clinical correlation with patient history and other diagnostic information is necessary to determine patient infection status. The expected result is Negative.  Fact  Sheet for Patients:  https://www.moore.com/  Fact Sheet for Healthcare Providers: https://www.young.biz/  This test is not yet approved or cleared by the [place] FDA and  has been authorized for detection and/or diagnosis of SARS-CoV-2 by FDA under an Emergency Use Authorization (EUA).  This EUA will remain in effect (meaning this test can b e used) for the duration of  the COVID-19 declaration under Section 564(b)(1) of the Act, 21 U.S.C. section 360bbb-3(b)(1), unless the authorization is terminated or revoked sooner.      Influenza A by PCR NEGATIVE NEGATIVE Final   Influenza B by PCR NEGATIVE NEGATIVE Final    Comment: (NOTE) The Xpert Xpress SARS-CoV-2/FLU/RSV assay is intended as an aid in  the diagnosis of influenza from Nasopharyngeal swab specimens and  should not be used as a sole basis for treatment. Nasal washings and  aspirates are unacceptable for Xpert Xpress SARS-CoV-2/FLU/RSV  testing.  Fact Sheet for Patients: https://www.moore.com/  Fact Sheet for Healthcare Providers: https://www.young.biz/  This test is not yet approved or cleared by the [place] FDA and  has been authorized for detection and/or diagnosis of SARS-CoV-2 by  FDA under an Emergency Use Authorization (EUA). This EUA will remain  in effect (meaning this test can be used) for the duration of the  Covid-19 declaration under Section 564(b)(1) of the Act, 21  U.S.C. section 360bbb-3(b)(1), unless the authorization is  terminated or revoked. Performed at [facility], [address] [place], [place] [address]   Blood culture (routine x 2)     Status: None   Collection Time: 04/25/2020  5:14 PM   Specimen: BLOOD  Result Value Ref Range Status   Specimen Description BLOOD LEFT ARM  Final   Special Requests   Final    BOTTLES DRAWN AEROBIC AND ANAEROBIC Blood Culture adequate volume   Culture   Final     NO GROWTH 5 DAYS Performed at [facility], [address] [place], [place] [address]    Report Status 05/20/2020 FINAL  Final  Blood culture (routine x 2)     Status: None   Collection Time: 04/20/2020  5:14 PM   Specimen: BLOOD  Result Value Ref Range Status   Specimen Description BLOOD RIGHT ARM  Final   Special Requests   Final    BOTTLES DRAWN AEROBIC AND ANAEROBIC Blood Culture adequate volume   Culture   Final  NO GROWTH 5 DAYS Performed at [facility], [address] [place], [place] [address]    Report Status 05/20/2020 FINAL  Final    Coagulation Studies: No results for input(s): LABPROT, INR in the last 72 hours.  Urinalysis: No results for input(s): COLORURINE, LABSPEC, PHURINE, GLUCOSEU, HGBUR, BILIRUBINUR, KETONESUR, PROTEINUR, UROBILINOGEN, NITRITE, LEUKOCYTESUR in the last 72 hours.  Invalid input(s): APPERANCEUR    Imaging: US Venous Img Lower Bilateral (DVT)  Result Date: 05/21/2020 CLINICAL DATA:  59 year old male with a history of positive D-dimer EXAM: BILATERAL LOWER EXTREMITY VENOUS DOPPLER ULTRASOUND TECHNIQUE: Gray-scale sonography with graded compression, as well as color Doppler and duplex ultrasound were performed to evaluate the lower extremity deep venous systems from the level of the common femoral vein and including the common femoral, femoral, profunda femoral, popliteal and calf veins including the posterior tibial, peroneal and gastrocnemius veins when visible. The superficial great saphenous vein was also interrogated. Spectral Doppler was utilized to evaluate flow at rest and with distal augmentation maneuvers in the common femoral, femoral and popliteal veins. COMPARISON:  None. FINDINGS: RIGHT LOWER EXTREMITY Common Femoral Vein: No evidence of thrombus. Normal compressibility, respiratory phasicity and response to augmentation. Saphenofemoral Junction: No evidence of thrombus. Normal compressibility and flow on color  Doppler imaging. Profunda Femoral Vein: No evidence of thrombus. Normal compressibility and flow on color Doppler imaging. Femoral Vein: No evidence of thrombus. Normal compressibility, respiratory phasicity and response to augmentation. Popliteal Vein: No evidence of thrombus. Normal compressibility, respiratory phasicity and response to augmentation. Calf Veins: No evidence of thrombus. Normal compressibility and flow on color Doppler imaging. Superficial Great Saphenous Vein: No evidence of thrombus. Normal compressibility and flow on color Doppler imaging. Other Findings:  Edema LEFT LOWER EXTREMITY Common Femoral Vein: No evidence of thrombus. Normal compressibility, respiratory phasicity and response to augmentation. Saphenofemoral Junction: No evidence of thrombus. Normal compressibility and flow on color Doppler imaging. Profunda Femoral Vein: No evidence of thrombus. Normal compressibility and flow on color Doppler imaging. Femoral Vein: No evidence of thrombus. Normal compressibility, respiratory phasicity and response to augmentation. Popliteal Vein: No evidence of thrombus. Normal compressibility, respiratory phasicity and response to augmentation. Calf Veins: No evidence of thrombus. Normal compressibility and flow on color Doppler imaging. Superficial Great Saphenous Vein: No evidence of thrombus. Normal compressibility and flow on color Doppler imaging. Other Findings:  Edema IMPRESSION: Sonographic survey of the bilateral lower extremities negative for DVT Electronically Signed   By: Gilmer MorJaime  Wagner D.O.   On: 05/21/2020 09:52   DG Chest Port 1 View  Result Date: 05/20/2020 CLINICAL DATA:  Shortness of breath, COVID and history of diabetes EXAM: PORTABLE CHEST 1 VIEW COMPARISON:  May 15, 2020 FINDINGS: Heart size and mediastinal contours are stable accounting for partially obscured LEFT heart border in the setting of worsening LEFT chest opacification since prior study. Interstitial and  airspace opacities of also developed in the RIGHT lung since the previous exam. No definite effusion. On limited assessment skeletal structures without acute process. IMPRESSION: Worsening opacification in the setting of LEFT chest and interstitial and airspace opacities in the RIGHT lung, findings compatible with worsening of multifocal pneumonia in the setting of COVID-19 infection. Electronically Signed   By: Donzetta KohutGeoffrey  Wile M.D.   On: 05/20/2020 08:35     Medications:    . amLODipine  10 mg Oral Daily  . vitamin C  500 mg Oral Daily  . aspirin EC  81 mg Oral Daily  . enoxaparin (LOVENOX) injection  1 mg/kg Subcutaneous Q24H  . famotidine  10 mg Oral Daily  . feeding supplement (NEPRO CARB STEADY)  237 mL Oral TID BM  . fluticasone  2 spray Each Nare Daily  . guaiFENesin  600 mg Oral BID  . hydrALAZINE  100 mg Oral TID  . influenza vac split quadrivalent PF  0.5 mL Intramuscular Tomorrow-1000  . insulin aspart  0-20 Units Subcutaneous TID WC  . insulin aspart  0-5 Units Subcutaneous QHS  . insulin aspart  5 Units Subcutaneous TID WC  . insulin glargine  35 Units Subcutaneous BID  . linagliptin  5 mg Oral Daily  . methylPREDNISolone (SOLU-MEDROL) injection  60 mg Intravenous Q12H  . multivitamin with minerals  1 tablet Oral Daily  . pneumococcal 23 valent vaccine  0.5 mL Intramuscular Tomorrow-1000  . zinc sulfate  220 mg Oral Daily   acetaminophen, chlorpheniramine-HYDROcodone, guaiFENesin-dextromethorphan, magnesium hydroxide, ondansetron **OR** ondansetron (ZOFRAN) IV, sodium chloride, traZODone  Assessment/ Plan:  Mr. Jackson Stark is a 59 y.o.  male presented from home for shortness of breath and increasing weakness.  Hypoxic on room air as per EMS.  Diagnosed with Covid pneumonia.  He has recurrent falls, nasal congestion.  Lab results showed acidosis, hyponatremia, hypokalemia, increased creatinine, low albumin.h/o diabetes for 20 years, was not compliant with medications for  the past one year.  #Acute kidney injury Cr about the same today at 4.15.  UOP 1.1 liters over the preceding 24 hours.    #Diabetes type 2, poorly controlled Last HbA1C: 13.8 on 05/16/20 On long acting and sliding scale insulin regimen  #Multifocal pneumonia secondary to COVID-19 Patient was on remdesivir and steroids, on HFNC.   #Hypokalemia K+ improved to 3.8, continue to monitor.      LOS: 6 Jackson Stark 10/2/20211:49 PM

## 2020-05-22 DIAGNOSIS — U071 COVID-19: Secondary | ICD-10-CM | POA: Diagnosis not present

## 2020-05-22 DIAGNOSIS — J9601 Acute respiratory failure with hypoxia: Secondary | ICD-10-CM | POA: Diagnosis not present

## 2020-05-22 DIAGNOSIS — N179 Acute kidney failure, unspecified: Secondary | ICD-10-CM | POA: Diagnosis not present

## 2020-05-22 DIAGNOSIS — E1365 Other specified diabetes mellitus with hyperglycemia: Secondary | ICD-10-CM | POA: Diagnosis not present

## 2020-05-22 LAB — CBC
HCT: 28.1 % — ABNORMAL LOW (ref 39.0–52.0)
Hemoglobin: 9.7 g/dL — ABNORMAL LOW (ref 13.0–17.0)
MCH: 28.2 pg (ref 26.0–34.0)
MCHC: 34.5 g/dL (ref 30.0–36.0)
MCV: 81.7 fL (ref 80.0–100.0)
Platelets: 218 10*3/uL (ref 150–400)
RBC: 3.44 MIL/uL — ABNORMAL LOW (ref 4.22–5.81)
RDW: 12.9 % (ref 11.5–15.5)
WBC: 9.9 10*3/uL (ref 4.0–10.5)
nRBC: 0 % (ref 0.0–0.2)

## 2020-05-22 LAB — GLUCOSE, CAPILLARY
Glucose-Capillary: 227 mg/dL — ABNORMAL HIGH (ref 70–99)
Glucose-Capillary: 335 mg/dL — ABNORMAL HIGH (ref 70–99)
Glucose-Capillary: 330 mg/dL — ABNORMAL HIGH (ref 70–99)
Glucose-Capillary: 292 mg/dL — ABNORMAL HIGH (ref 70–99)

## 2020-05-22 LAB — BASIC METABOLIC PANEL
Anion gap: 10 (ref 5–15)
BUN: 97 mg/dL — ABNORMAL HIGH (ref 6–20)
CO2: 25 mmol/L (ref 22–32)
Calcium: 8 mg/dL — ABNORMAL LOW (ref 8.9–10.3)
Chloride: 97 mmol/L — ABNORMAL LOW (ref 98–111)
Creatinine, Ser: 4.21 mg/dL — ABNORMAL HIGH (ref 0.61–1.24)
GFR calc Af Amer: 17 mL/min — ABNORMAL LOW (ref >60)
GFR calc non Af Amer: 15 mL/min — ABNORMAL LOW (ref >60)
Glucose, Bld: 226 mg/dL — ABNORMAL HIGH (ref 70–99)
Potassium: 3.7 mmol/L (ref 3.5–5.1)
Sodium: 132 mmol/L — ABNORMAL LOW (ref 135–145)

## 2020-05-22 LAB — FIBRIN DERIVATIVES D-DIMER (ARMC ONLY): Fibrin derivatives D-dimer (ARMC): 1592.9 ng/mL (FEU) — ABNORMAL HIGH (ref 0.00–499.00)

## 2020-05-22 LAB — C-REACTIVE PROTEIN: CRP: 3.8 mg/dL — ABNORMAL HIGH (ref <1.0)

## 2020-05-22 LAB — MAGNESIUM: Magnesium: 2.2 mg/dL (ref 1.7–2.4)

## 2020-05-22 MED ORDER — CARVEDILOL 3.125 MG PO TABS
6.2500 mg | ORAL_TABLET | Freq: Two times a day (BID) | ORAL | Status: DC
  Administered 2020-05-22 (×2): 6.25 mg via ORAL
  Filled 2020-05-22 (×2): qty 2

## 2020-05-22 MED ORDER — INSULIN GLARGINE 100 UNIT/ML ~~LOC~~ SOLN
45.0000 [IU] | Freq: Two times a day (BID) | SUBCUTANEOUS | Status: DC
  Administered 2020-05-22 (×2): 45 [IU] via SUBCUTANEOUS
  Filled 2020-05-22 (×3): qty 0.45

## 2020-05-22 MED ORDER — INSULIN ASPART 100 UNIT/ML ~~LOC~~ SOLN
8.0000 [IU] | Freq: Three times a day (TID) | SUBCUTANEOUS | Status: DC
  Administered 2020-05-22 (×3): 8 [IU] via SUBCUTANEOUS
  Filled 2020-05-22 (×3): qty 1

## 2020-05-22 NOTE — Progress Notes (Signed)
Called Shella Maxim, emergency contact listed in [facility]; gave up date on pt's 24 hrs.

## 2020-05-22 NOTE — Progress Notes (Signed)
[facility]  ROUNDING NOTE   Subjective:   Patient seen and evaluated at bedside. Still having some shortness of breath. Urine output was 800 cc of the preceding 24 hours. Overall renal function remains low with a creatinine of 4.2.  10/02 0701 - 10/03 0700 In: 237 [P.O.:237] Out: 800 [Urine:800]  Lab Results  Component Value Date   CREATININE 4.21 (H) 05/22/2020   CREATININE 4.15 (H) 05/21/2020   CREATININE 4.11 (H) 05/20/2020     Objective:  Vital signs in last 24 hours:  Temp:  [97.1 F (36.2 C)-98.1 F (36.7 C)] 98.1 F (36.7 C) (10/03 1309) Pulse Rate:  [72-106] 89 (10/03 1309) Resp:  [16-20] 19 (10/03 1309) BP: (126-163)/(67-95) 161/67 (10/03 1309) SpO2:  [90 %-97 %] 97 % (10/03 1309)  Weight change:  Filed Weights   05/05/2020 1707 05/16/20 1539  Weight: 77.1 kg 71.7 kg    Intake/Output: I/O last 3 completed shifts: In: 237 [P.O.:237] Out: 1100 [Urine:1100]   Intake/Output this shift:  Total I/O In: 474 [P.O.:474] Out: 500 [Urine:500]  Physical Exam: General: Resting in bed  Head: High flow nasal canula in place  Eyes: Anicteric  Lungs:  Nonlabored breathing  Heart: Regular rate and rhythm  Abdomen:  Nontender, non distended  Extremities: No  peripheral edema.  Neurologic: Alert, oriented  Skin: No acute rashes or  lesions       Basic Metabolic Panel: Recent Labs  Lab 05/18/20 0538 05/18/20 0538 05/19/20 0320 05/19/20 0320 05/20/20 0359 05/21/20 0540 05/22/20 0401  NA 128*  --  137  --  137 132* 132*  K 2.8*  --  3.0*  --  3.6 3.8 3.7  CL 95*  --  101  --  103 98 97*  CO2 20*  --  22  --  23 22 25   GLUCOSE 442*  --  126*  --  209* 311* 226*  BUN 106*  --  103*  --  99* 97* 97*  CREATININE 4.97*  --  4.41*  --  4.11* 4.15* 4.21*  CALCIUM 7.9*   < > 8.1*   < > 8.0* 8.3* 8.0*  MG  --   --  2.0  --  2.2 2.3 2.2   < > = values in this interval not displayed.    Liver Function Tests: Recent Labs  Lab 05/16/20 0601  05/17/20 0541 05/18/20 0538 05/19/20 0320 05/20/20 0359  AST 23 26 21 25  37  ALT 15 14 16 15 22   ALKPHOS 74 69 72 74 69  BILITOT 1.0 0.9 0.8 0.7 0.7  PROT 5.9* 5.7* 5.2* 5.3* 5.1*  ALBUMIN 2.3* 2.2* 2.1* 2.2* 2.2*   No results for input(s): LIPASE, AMYLASE in the last 168 hours. No results for input(s): AMMONIA in the last 168 hours.  CBC: Recent Labs  Lab 05/16/20 0601 05/16/20 0601 05/17/20 0541 05/17/20 0541 05/18/20 0538 05/19/20 0320 05/20/20 0359 05/21/20 0540 05/22/20 0401  WBC 2.9*   < > 5.1   < > 5.5 8.0 8.0 6.8 9.9  NEUTROABS 2.5  --  4.4  --  5.0 7.3 6.8  --   --   HGB 11.8*   < > 11.6*   < > 10.7* 10.9* 10.8* 10.5* 9.7*  HCT 32.7*   < > 31.2*   < > 28.8* 28.6* 29.1* 28.4* 28.1*  MCV 79.6*   < > 77.6*   < > 78.3* 75.9* 77.6* 77.8* 81.7  PLT 168   < > 190   < >  210 194 191 225 218   < > = values in this interval not displayed.    Cardiac Enzymes: No results for input(s): CKTOTAL, CKMB, CKMBINDEX, TROPONINI in the last 168 hours.  BNP: Invalid input(s): POCBNP  CBG: Recent Labs  Lab 05/21/20 1101 05/21/20 1621 05/21/20 2005 05/22/20 0738 05/22/20 1214  GLUCAP 378* 287* 246* 227* 335*    Microbiology: Results for orders placed or performed during the hospital encounter of 06-02-20  Respiratory Panel by RT PCR (Flu A&B, Covid) - Nasopharyngeal Swab     Status: Abnormal   Collection Time: 2020/06/02  5:14 PM   Specimen: Nasopharyngeal Swab  Result Value Ref Range Status   SARS Coronavirus 2 by RT PCR POSITIVE (A) NEGATIVE Final    Comment: RESULT CALLED TO, READ BACK BY AND VERIFIED WITH: LORRIE LEMONS ON 06-02-20 AT 1851 QSD (NOTE) SARS-CoV-2 target nucleic acids are DETECTED.  SARS-CoV-2 RNA is generally detectable in upper respiratory specimens  during the acute phase of infection. Positive results are indicative of the presence of the identified virus, but do not rule out bacterial infection or co-infection with other pathogens not detected  by the test. Clinical correlation with patient history and other diagnostic information is necessary to determine patient infection status. The expected result is Negative.  Fact Sheet for Patients:  https://www.moore.com/  Fact Sheet for Healthcare Providers: https://www.young.biz/  This test is not yet approved or cleared by the [place] FDA and  has been authorized for detection and/or diagnosis of SARS-CoV-2 by FDA under an Emergency Use Authorization (EUA).  This EUA will remain in effect (meaning this test can b e used) for the duration of  the COVID-19 declaration under Section 564(b)(1) of the Act, 21 U.S.C. section 360bbb-3(b)(1), unless the authorization is terminated or revoked sooner.      Influenza A by PCR NEGATIVE NEGATIVE Final   Influenza B by PCR NEGATIVE NEGATIVE Final    Comment: (NOTE) The Xpert Xpress SARS-CoV-2/FLU/RSV assay is intended as an aid in  the diagnosis of influenza from Nasopharyngeal swab specimens and  should not be used as a sole basis for treatment. Nasal washings and  aspirates are unacceptable for Xpert Xpress SARS-CoV-2/FLU/RSV  testing.  Fact Sheet for Patients: https://www.moore.com/  Fact Sheet for Healthcare Providers: https://www.young.biz/  This test is not yet approved or cleared by the [place] FDA and  has been authorized for detection and/or diagnosis of SARS-CoV-2 by  FDA under an Emergency Use Authorization (EUA). This EUA will remain  in effect (meaning this test can be used) for the duration of the  Covid-19 declaration under Section 564(b)(1) of the Act, 21  U.S.C. section 360bbb-3(b)(1), unless the authorization is  terminated or revoked. Performed at [facility], [address] [place], [place] [address]   Blood culture (routine x 2)     Status: None   Collection Time: 06-02-2020  5:14 PM   Specimen: BLOOD  Result  Value Ref Range Status   Specimen Description BLOOD LEFT ARM  Final   Special Requests   Final    BOTTLES DRAWN AEROBIC AND ANAEROBIC Blood Culture adequate volume   Culture   Final    NO GROWTH 5 DAYS Performed at [facility], [address], [place], [place] [address]    Report Status 05/20/2020 FINAL  Final  Blood culture (routine x 2)     Status: None   Collection Time: 06/02/2020  5:14 PM   Specimen: BLOOD  Result Value Ref  Range Status   Specimen Description BLOOD RIGHT ARM  Final   Special Requests   Final    BOTTLES DRAWN AEROBIC AND ANAEROBIC Blood Culture adequate volume   Culture   Final    NO GROWTH 5 DAYS Performed at [facility], [address] [place], [place] [address]    Report Status 05/20/2020 FINAL  Final    Coagulation Studies: No results for input(s): LABPROT, INR in the last 72 hours.  Urinalysis: No results for input(s): COLORURINE, LABSPEC, PHURINE, GLUCOSEU, HGBUR, BILIRUBINUR, KETONESUR, PROTEINUR, UROBILINOGEN, NITRITE, LEUKOCYTESUR in the last 72 hours.  Invalid input(s): APPERANCEUR    Imaging: US Venous Img Lower Bilateral (DVT)  Result Date: 05/21/2020 CLINICAL DATA:  59 year old male with a history of positive D-dimer EXAM: BILATERAL LOWER EXTREMITY VENOUS DOPPLER ULTRASOUND TECHNIQUE: Gray-scale sonography with graded compression, as well as color Doppler and duplex ultrasound were performed to evaluate the lower extremity deep venous systems from the level of the common femoral vein and including the common femoral, femoral, profunda femoral, popliteal and calf veins including the posterior tibial, peroneal and gastrocnemius veins when visible. The superficial great saphenous vein was also interrogated. Spectral Doppler was utilized to evaluate flow at rest and with distal augmentation maneuvers in the common femoral, femoral and popliteal veins. COMPARISON:  None. FINDINGS: RIGHT LOWER EXTREMITY Common Femoral Vein: No  evidence of thrombus. Normal compressibility, respiratory phasicity and response to augmentation. Saphenofemoral Junction: No evidence of thrombus. Normal compressibility and flow on color Doppler imaging. Profunda Femoral Vein: No evidence of thrombus. Normal compressibility and flow on color Doppler imaging. Femoral Vein: No evidence of thrombus. Normal compressibility, respiratory phasicity and response to augmentation. Popliteal Vein: No evidence of thrombus. Normal compressibility, respiratory phasicity and response to augmentation. Calf Veins: No evidence of thrombus. Normal compressibility and flow on color Doppler imaging. Superficial Great Saphenous Vein: No evidence of thrombus. Normal compressibility and flow on color Doppler imaging. Other Findings:  Edema LEFT LOWER EXTREMITY Common Femoral Vein: No evidence of thrombus. Normal compressibility, respiratory phasicity and response to augmentation. Saphenofemoral Junction: No evidence of thrombus. Normal compressibility and flow on color Doppler imaging. Profunda Femoral Vein: No evidence of thrombus. Normal compressibility and flow on color Doppler imaging. Femoral Vein: No evidence of thrombus. Normal compressibility, respiratory phasicity and response to augmentation. Popliteal Vein: No evidence of thrombus. Normal compressibility, respiratory phasicity and response to augmentation. Calf Veins: No evidence of thrombus. Normal compressibility and flow on color Doppler imaging. Superficial Great Saphenous Vein: No evidence of thrombus. Normal compressibility and flow on color Doppler imaging. Other Findings:  Edema IMPRESSION: Sonographic survey of the bilateral lower extremities negative for DVT Electronically Signed   By: Gilmer MorJaime  Wagner D.O.   On: 05/21/2020 09:52     Medications:    . amLODipine  10 mg Oral Daily  . vitamin C  500 mg Oral Daily  . aspirin EC  81 mg Oral Daily  . carvedilol  6.25 mg Oral BID WC  . enoxaparin (LOVENOX)  injection  1 mg/kg Subcutaneous Q24H  . famotidine  10 mg Oral Daily  . feeding supplement (NEPRO CARB STEADY)  237 mL Oral TID BM  . fluticasone  2 spray Each Nare Daily  . guaiFENesin  600 mg Oral BID  . hydrALAZINE  100 mg Oral TID  . influenza vac split quadrivalent PF  0.5 mL Intramuscular Tomorrow-1000  . insulin aspart  0-20 Units Subcutaneous TID WC  . insulin aspart  0-5 Units Subcutaneous QHS  .  insulin aspart  8 Units Subcutaneous TID WC  . insulin glargine  45 Units Subcutaneous BID  . linagliptin  5 mg Oral Daily  . methylPREDNISolone (SOLU-MEDROL) injection  60 mg Intravenous Q12H  . multivitamin with minerals  1 tablet Oral Daily  . pneumococcal 23 valent vaccine  0.5 mL Intramuscular Tomorrow-1000  . zinc sulfate  220 mg Oral Daily   acetaminophen, chlorpheniramine-HYDROcodone, guaiFENesin-dextromethorphan, magnesium hydroxide, ondansetron **OR** ondansetron (ZOFRAN) IV, sodium chloride, traZODone  Assessment/ Plan:  Mr. Jackson Stark is a 59 y.o.  male presented from home for shortness of breath and increasing weakness.  Hypoxic on room air as per EMS.  Diagnosed with Covid pneumonia.  He has recurrent falls, nasal congestion.  Lab results showed acidosis, hyponatremia, hypokalemia, increased creatinine, low albumin.h/o diabetes for 20 years, was not compliant with medications for the past one year.  #Acute kidney injury Urine output was 800 cc of the patient 24 hours.  Creatinine currently 4.21.  No immediate need for dialysis however this may need to be considered if renal parameters continue to worsen.  #Diabetes type 2, poorly controlled Last HbA1C: 13.8 on 05/16/20 On long acting and sliding scale insulin regimen, glycemic control as per hospitalist.  #Multifocal pneumonia secondary to COVID-19 Currently on Solu-Medrol.  Continue supplemental oxygen as well.   #Hypokalemia Potassium up to 3.7.     LOS: 7 Vania Rosero 10/3/20212:59 PM

## 2020-05-22 NOTE — Progress Notes (Signed)
PROGRESS NOTE    Jackson Stark  ZHG:[id number] DOB: 16-Dec-1960 DOA: 05/18/2020 PCP: Patient, No Pcp Per    Brief Narrative:  Jackson Stark is a 59 year old male with past medical history notable for COPD, essential hypertension, type 2 diabetes mellitus who presented to the emergency department with progressive shortness of breath associated with dry cough and generalized weakness.  Also with recurrent falls.  In the ED, patient was noted to be hypoxic with SPO2 80% on room air with improvement to 93% on 10 L NRB.  Creatinine 5.78, anion gap 18, BNP 80.5.  Chest x-ray notable for left basal infiltrate.  Patient was started on IV Decadron and doxycycline and given 2 L bolus of normal saline.  TRH consulted for admission.   Assessment & Plan:   Active Problems:   Acute hypoxemic respiratory failure due to COVID-19 [facility])   Malnutrition of moderate degree   AKI (acute kidney injury) (HCC)   Generalized weakness   DM (diabetes mellitus), secondary uncontrolled (HCC)   Acute respiratory failure with hypoxia (HCC)   Acute hypoxic respiratory failure secondary to acute Covid-19 viral pneumonia during the ongoing 2020 Covid 19 Pandemic - POA Patient presenting to ED with progressive shortness of breath.  Was found to be hypoxic with SPO2 80% on room air on arrival.  Chest x-ray consistent with Covid-19 viral pneumonia; with repeat x-ray on 05/20/2020 showing worsening progression of multifocal pneumonia. --COVID test: + 04/29/2020 --CRP 8.7>6.2>4.5>3.0>2.6>7.4>3.8 --ddimer 1,962>229>[id number]>4481 --Completed 5-day course of remdesivir on 05/19/2020 --Unable to start baricitinib 2/2 renal insufficiency/acute renal failure --Continue Solu-Medrol 60 mg IV q12h --prone for 2-3hrs every 12hrs if able --Continue supplemental oxygen, titrate to maintain SPO2 greater than 85%, on 11L HFNC --Continue supportive care with albuterol MDI prn, vitamin C, zinc, Tylenol, antitussives  (benzonatate/ Mucinex/Tussionex) --Follow CBC, CMP, D-dimer, ferritin, and CRP daily --Continue airborne/contact isolation precautions for 3 weeks from the day of diagnosis  The treatment plan and use of medications and known side effects were discussed with patient/family. Some of the medications used are based on case reports/anecdotal data.  All other medications being used in the management of COVID-19 based on limited study data.  Complete risks and long-term side effects are unknown, however in the best clinical judgment they seem to be of some benefit.  Patient wanted to proceed with treatment options provided.  Elevated D-dimer D-dimer now trending up despite treatment for Covid-19, 905>925>1475>2118.  Concern of blood clot whether DVT versus PE given known hypercoagulable state with Covid-19 infection coupled with poor mobility.  Unable to obtain CT angiogram chest due to renal failure.  Unable to obtain nuclear medicine VQ scan due to patient's inability to lay flat for 20 minutes with resultant worsening of his hypoxia.  Venous duplex vascular ultrasound bilateral lower extremities negative for DVT. --Started treatment dose Lovenox 10/1 --Continue to follow D-dimer daily  Acute renal failure Unknown baseline, poor outpatient follow-up/medical noncompliance.  Presented with creatinine 5.59.  Renal ultrasound with no acute abnormality. --Nephrology following, appreciate assistance --Cr 5.59>4.41>4.11>4.15>4.21 --Discontinued IV fluids on 10/1 due to worsening hypoxemia --Avoid nephrotoxins, renally dose all medications --Close monitoring of urinary output --Follow BMP daily  Hypokalemia Potassium 3.7 today --Repeat electrolytes in a.m. to include magnesium  Type 2 diabetes mellitus Hemoglobin A1c 13.8, poorly controlled. --Tradjenta 5 mg p.o. daily --Increase Lantus to 45 units [place] BID --NovoLog 8 units TID AC --Resistant insulin sliding scale for coverage --CBGs  qAC/HS  Essential hypertension BP 163/82 this morning. (range 126/90 -  173/76 past 24h) --amlodipine 10 mg p.o. daily --Hydralazine to 100 mg p.o. TID --started Coreg 6.25mg  BID --Continue monitor BP closely and adjust as needed  Tobacco use disorder Counseled.   DVT prophylaxis: Lovenox Code Status: Full code Family Communication: Attempted to update patient's fianc via telephone this morning unsuccessful with no answer, she was updated extensively yesterday afternoon  Disposition Plan:  Status is: Inpatient  Remains inpatient appropriate because:Persistent severe electrolyte disturbances, Ongoing diagnostic testing needed not appropriate for outpatient work up, Unsafe d/c plan, IV treatments appropriate due to intensity of illness or inability to take PO and Inpatient level of care appropriate due to severity of illness   Dispo: The patient is from: Home              Anticipated d/c is to: Home              Anticipated d/c date is: > 3 days              Patient currently is not medically stable to d/c.  Continues with high FiO2 requirements   Consultants:   Nephrology  Procedures:   Renal ultrasound  Vascular duplex venous ultrasound bilateral lower extremities  Antimicrobials:   None   Subjective: Patient seen and examined bedside, resting comfortably.  Continues on 11 L high flow nasal cannula.  No significant change since yesterday.  States he "enjoys drinking coffee".  Continues with slow progression to wean from high flow nasal oxygen.  No other questions or concerns at this time. Denies headache, no visual changes, no chest pain, no dizziness, no palpitations, no fever/chills/night sweats, no nausea/vomiting/diarrhea, no fatigue, no abdominal pain, no weakness.  No acute concerns per nursing staff at this time.   Objective: Vitals:   05/22/20 0105 05/22/20 0453 05/22/20 0740 05/22/20 0850  BP: (!) 134/95 (!) 163/82 (!) 160/80   Pulse: 95 100 96   Resp: Temp: (!) 97.5 F (36.4 C) 97.6 F (36.4 C) (!) 97.1 F (36.2 C)   TempSrc: Oral Oral Axillary   SpO2: 96% 93% 90% 90%  Weight:      Height:        Intake/Output Summary (Last 24 hours) at 05/22/2020 0929 Last data filed at 05/22/2020 0817 Gross per 24 hour  Intake 237 ml  Output 600 ml  Net -363 ml   Filed Weights   06-09-20 1707 05/16/20 1539  Weight: 77.1 kg 71.7 kg    Examination:  General exam: Appears calm and comfortable, chronically ill in appearance Respiratory system: Decreased breath sounds bilateral bases, no wheezing/rhonchi, normal respiratory effort without accessory muscle use, on 11L high flow nasal cannula Cardiovascular system: S1 & S2 heard, RRR. No JVD, murmurs, rubs, gallops or clicks. No pedal edema. Gastrointestinal system: Abdomen is nondistended, soft and nontender. No organomegaly or masses felt. Normal bowel sounds heard. Central nervous system: Alert and oriented. No focal neurological deficits. Extremities: Symmetric 5 x 5 power. Skin: No rashes, lesions or ulcers Psychiatry: Judgement and insight appear poor. Mood & affect appropriate.     Data Reviewed: I have personally reviewed following labs and imaging studies  CBC: Recent Labs  Lab 05/16/20 0601 05/16/20 0601 05/17/20 0541 05/17/20 0541 05/18/20 0538 05/19/20 0320 05/20/20 0359 05/21/20 0540 05/22/20 0401  WBC 2.9*   < > 5.1   < > 5.5 8.0 8.0 6.8 9.9  NEUTROABS 2.5  --  4.4  --  5.0 7.3 6.8  --   --  HGB 11.8*   < > 11.6*   < > 10.7* 10.9* 10.8* 10.5* 9.7*  HCT 32.7*   < > 31.2*   < > 28.8* 28.6* 29.1* 28.4* 28.1*  MCV 79.6*   < > 77.6*   < > 78.3* 75.9* 77.6* 77.8* 81.7  PLT 168   < > 190   < > 210 194 191 225 218   < > = values in this interval not displayed.   Basic Metabolic Panel: Recent Labs  Lab 05/18/20 0538 05/19/20 0320 05/20/20 0359 05/21/20 0540 05/22/20 0401  NA 128* 137 137 132* 132*  K 2.8* 3.0* 3.6 3.8 3.7  CL 95* 101 103 98 97*  CO2 20*  22 23 22 25   GLUCOSE 442* 126* 209* 311* 226*  BUN 106* 103* 99* 97* 97*  CREATININE 4.97* 4.41* 4.11* 4.15* 4.21*  CALCIUM 7.9* 8.1* 8.0* 8.3* 8.0*  MG  --  2.0 2.2 2.3 2.2   GFR: Estimated Creatinine Clearance: 19.6 mL/min (A) (by C-G formula based on SCr of 4.21 mg/dL (H)). Liver Function Tests: Recent Labs  Lab 05/16/20 0601 05/17/20 0541 05/18/20 0538 05/19/20 0320 05/20/20 0359  AST 23 26 21 25  37  ALT 15 14 16 15 22   ALKPHOS 74 69 72 74 69  BILITOT 1.0 0.9 0.8 0.7 0.7  PROT 5.9* 5.7* 5.2* 5.3* 5.1*  ALBUMIN 2.3* 2.2* 2.1* 2.2* 2.2*   No results for input(s): LIPASE, AMYLASE in the last 168 hours. No results for input(s): AMMONIA in the last 168 hours. Coagulation Profile: No results for input(s): INR, PROTIME in the last 168 hours. Cardiac Enzymes: No results for input(s): CKTOTAL, CKMB, CKMBINDEX, TROPONINI in the last 168 hours. BNP (last 3 results) No results for input(s): PROBNP in the last 8760 hours. HbA1C: No results for input(s): HGBA1C in the last 72 hours. CBG: Recent Labs  Lab 05/21/20 0723 05/21/20 1101 05/21/20 1621 05/21/20 2005 05/22/20 0738  GLUCAP 278* 378* 287* 246* 227*   Lipid Profile: No results for input(s): CHOL, HDL, LDLCALC, TRIG, CHOLHDL, LDLDIRECT in the last 72 hours. Thyroid Function Tests: No results for input(s): TSH, T4TOTAL, FREET4, T3FREE, THYROIDAB in the last 72 hours. Anemia Panel: No results for input(s): VITAMINB12, FOLATE, FERRITIN, TIBC, IRON, RETICCTPCT in the last 72 hours. Sepsis Labs: No results for input(s): PROCALCITON, LATICACIDVEN in the last 168 hours.  Recent Results (from the past 240 hour(s))  Respiratory Panel by RT PCR (Flu A&B, Covid) - Nasopharyngeal Swab     Status: Abnormal   Collection Time: 04/23/2020  5:14 PM   Specimen: Nasopharyngeal Swab  Result Value Ref Range Status   SARS Coronavirus 2 by RT PCR POSITIVE (A) NEGATIVE Final    Comment: RESULT CALLED TO, READ BACK BY AND VERIFIED  WITH: LORRIE LEMONS ON 05/17/2020 AT 1851 QSD (NOTE) SARS-CoV-2 target nucleic acids are DETECTED.  SARS-CoV-2 RNA is generally detectable in upper respiratory specimens  during the acute phase of infection. Positive results are indicative of the presence of the identified virus, but do not rule out bacterial infection or co-infection with other pathogens not detected by the test. Clinical correlation with patient history and other diagnostic information is necessary to determine patient infection status. The expected result is Negative.  Fact Sheet for Patients:  07/22/20  Fact Sheet for Healthcare Providers: 05/17/20  This test is not yet approved or cleared by the 05/17/20 FDA and  has been authorized for detection and/or diagnosis of SARS-CoV-2 by FDA under an Emergency  Use Authorization (EUA).  This EUA will remain in effect (meaning this test can b e used) for the duration of  the COVID-19 declaration under Section 564(b)(1) of the Act, 21 U.S.C. section 360bbb-3(b)(1), unless the authorization is terminated or revoked sooner.      Influenza A by PCR NEGATIVE NEGATIVE Final   Influenza B by PCR NEGATIVE NEGATIVE Final    Comment: (NOTE) The Xpert Xpress SARS-CoV-2/FLU/RSV assay is intended as an aid in  the diagnosis of influenza from Nasopharyngeal swab specimens and  should not be used as a sole basis for treatment. Nasal washings and  aspirates are unacceptable for Xpert Xpress SARS-CoV-2/FLU/RSV  testing.  Fact Sheet for Patients: https://www.moore.com/https://www.fda.gov/media/142436/download  Fact Sheet for Healthcare Providers: https://www.young.biz/https://www.fda.gov/media/142435/download  This test is not yet approved or cleared by the [place] FDA and  has been authorized for detection and/or diagnosis of SARS-CoV-2 by  FDA under an Emergency Use Authorization (EUA). This EUA will remain  in effect (meaning this test can be  used) for the duration of the  Covid-19 declaration under Section 564(b)(1) of the Act, 21  U.S.C. section 360bbb-3(b)(1), unless the authorization is  terminated or revoked. Performed at [facility], [address] [place], [place] [address]   Blood culture (routine x 2)     Status: None   Collection Time: 02/11/2020  5:14 PM   Specimen: BLOOD  Result Value Ref Range Status   Specimen Description BLOOD LEFT ARM  Final   Special Requests   Final    BOTTLES DRAWN AEROBIC AND ANAEROBIC Blood Culture adequate volume   Culture   Final    NO GROWTH 5 DAYS Performed at [facility], [address] [place], [place] [address]    Report Status 05/20/2020 FINAL  Final  Blood culture (routine x 2)     Status: None   Collection Time: 02/11/2020  5:14 PM   Specimen: BLOOD  Result Value Ref Range Status   Specimen Description BLOOD RIGHT ARM  Final   Special Requests   Final    BOTTLES DRAWN AEROBIC AND ANAEROBIC Blood Culture adequate volume   Culture   Final    NO GROWTH 5 DAYS Performed at [facility], [address] [place], [place] [address]    Report Status 05/20/2020 FINAL  Final         Radiology Studies: US Venous Img Lower Bilateral (DVT)  Result Date: 05/21/2020 CLINICAL DATA:  59 year old male with a history of positive D-dimer EXAM: BILATERAL LOWER EXTREMITY VENOUS DOPPLER ULTRASOUND TECHNIQUE: Gray-scale sonography with graded compression, as well as color Doppler and duplex ultrasound were performed to evaluate the lower extremity deep venous systems from the level of the common femoral vein and including the common femoral, femoral, profunda femoral, popliteal and calf veins including the posterior tibial, peroneal and gastrocnemius veins when visible. The superficial great saphenous vein was also interrogated. Spectral Doppler was utilized to evaluate flow at rest and with distal augmentation maneuvers in the common femoral, femoral and  popliteal veins. COMPARISON:  None. FINDINGS: RIGHT LOWER EXTREMITY Common Femoral Vein: No evidence of thrombus. Normal compressibility, respiratory phasicity and response to augmentation. Saphenofemoral Junction: No evidence of thrombus. Normal compressibility and flow on color Doppler imaging. Profunda Femoral Vein: No evidence of thrombus. Normal compressibility and flow on color Doppler imaging. Femoral Vein: No evidence of thrombus. Normal compressibility, respiratory phasicity and response to augmentation. Popliteal Vein: No evidence of thrombus. Normal compressibility, respiratory phasicity and response to  augmentation. Calf Veins: No evidence of thrombus. Normal compressibility and flow on color Doppler imaging. Superficial Great Saphenous Vein: No evidence of thrombus. Normal compressibility and flow on color Doppler imaging. Other Findings:  Edema LEFT LOWER EXTREMITY Common Femoral Vein: No evidence of thrombus. Normal compressibility, respiratory phasicity and response to augmentation. Saphenofemoral Junction: No evidence of thrombus. Normal compressibility and flow on color Doppler imaging. Profunda Femoral Vein: No evidence of thrombus. Normal compressibility and flow on color Doppler imaging. Femoral Vein: No evidence of thrombus. Normal compressibility, respiratory phasicity and response to augmentation. Popliteal Vein: No evidence of thrombus. Normal compressibility, respiratory phasicity and response to augmentation. Calf Veins: No evidence of thrombus. Normal compressibility and flow on color Doppler imaging. Superficial Great Saphenous Vein: No evidence of thrombus. Normal compressibility and flow on color Doppler imaging. Other Findings:  Edema IMPRESSION: Sonographic survey of the bilateral lower extremities negative for DVT Electronically Signed   By: Gilmer Mor D.O.   On: 05/21/2020 09:52        Scheduled Meds: . amLODipine  10 mg Oral Daily  . vitamin C  500 mg Oral Daily  .  aspirin EC  81 mg Oral Daily  . carvedilol  6.25 mg Oral BID WC  . enoxaparin (LOVENOX) injection  1 mg/kg Subcutaneous Q24H  . famotidine  10 mg Oral Daily  . feeding supplement (NEPRO CARB STEADY)  237 mL Oral TID BM  . fluticasone  2 spray Each Nare Daily  . guaiFENesin  600 mg Oral BID  . hydrALAZINE  100 mg Oral TID  . influenza vac split quadrivalent PF  0.5 mL Intramuscular Tomorrow-1000  . insulin aspart  0-20 Units Subcutaneous TID WC  . insulin aspart  0-5 Units Subcutaneous QHS  . insulin aspart  8 Units Subcutaneous TID WC  . insulin glargine  45 Units Subcutaneous BID  . linagliptin  5 mg Oral Daily  . methylPREDNISolone (SOLU-MEDROL) injection  60 mg Intravenous Q12H  . multivitamin with minerals  1 tablet Oral Daily  . pneumococcal 23 valent vaccine  0.5 mL Intramuscular Tomorrow-1000  . zinc sulfate  220 mg Oral Daily   Continuous Infusions:    LOS: 7 days    Time spent: 38 minutes spent on chart review, discussion with nursing staff, consultants, updating family and interview/physical exam; more than 50% of that time was spent in counseling and/or coordination of care.    Alvira Philips [place], DO [facility] Available via Epic secure chat 7am-7pm After these hours, please refer to coverage provider listed on [email or web address] 05/22/2020, 9:29 AM

## 2020-05-22 NOTE — Consult Note (Signed)
PHARMACY CONSULT NOTE - FOLLOW UP  Pharmacy Consult for Electrolyte Monitoring and Replacement   Recent Labs: Potassium (mmol/L)  Date Value  05/22/2020 3.7   Magnesium (mg/dL)  Date Value  56/97/9480 2.2   Calcium (mg/dL)  Date Value  16/55/3748 8.0 (L)   Albumin (g/dL)  Date Value  27/02/8674 2.2 (L)   Sodium (mmol/L)  Date Value  05/22/2020 132 (L)     Assessment: NathanTidwellis a58 y.o.malewith a known history of COPD, hypertension and type 2 diabetes mellitus, who presented to the emergency room with acute onset of worsening dyspnea/COVID PNA and hyperglycemia.  BG currently 191 on prednisone, SSI, and glargine 25 units bid.  Pharmacy has been consulted to monitor/replenish K. 10/01 K 3.6  Goal of Therapy:  K ~ 4  Plan:  K 3.7  Mag 2.2  Scr 4.21 . No replacement at this time. Conservative due to renal fxn. If trends down again consider replacing K with low dose. Will follow up on AM labs.  Angelique Blonder, PharmD, BCPS Clinical Pharmacist 05/22/2020 9:46 AM]

## 2020-05-22 NOTE — Evaluation (Signed)
Physical Therapy Evaluation Patient Details Name: Jackson Stark MRN: [id number] DOB: 1960/09/06 Today's Date: 05/22/2020   History of Present Illness  Patient is a 59 y.o. male who presented from home for shortness of breath, dry cough, and and increasing weakness, recurrent falls. Work up revealed Acute hypoxic respiratory failure secondary to acute Covid-19 viral pneumonia, elevated D-dimer, Venous duplex vascular ultrasound bilateral lower extremities negative for DVT, acute renal familure, hypokalemia.  Medical history of  COPD, essential hypertension, and type 2 diabetes mellitus.   Clinical Impression  PT evaluation completed. Patient is alert and cooperative during session. Patient currently on 11L02. Patient desaturated to 79%, heart rate 122bpm with sitting up on edge of bed and takes 3-5 minute rest break back in bed to recovery to 89-90%. Patient participated with in bed BLE therapeutic exercise for strength and endurance with Sp02 88-89%. Patient is Mod I for bed mobility without use of bed rails. Unable to assess transfers, standing balance, or ambulation due to decrease in Sp02 with seated level activity. Patient is anxious and tearful at times. Recommend PT to address functional limitations to maximize independence in preparation for eventual discharge home.      Follow Up Recommendations Home health PT    Equipment Recommendations   (ongoing assessment )    Recommendations for Other Services       Precautions / Restrictions Precautions Precautions: Fall Restrictions Weight Bearing Restrictions: No      Mobility  Bed Mobility Overal bed mobility: Modified Independent             General bed mobility comments: supine to and from shot sitting with head of bed elevated and no use of bed rails. extra time required to complete tasks   Transfers                 General transfer comment: unable to assess transfers due to Sp02 decreasing to 79% with sitting up  on edge of bed. patient reports he gets to bed side commode without assistance as needed   Ambulation/Gait                Stairs            Wheelchair Mobility    Modified Rankin (Stroke Patients Only)       Balance Overall balance assessment: Needs assistance;History of Falls Sitting-balance support: Feet supported;Bilateral upper extremity supported Sitting balance-Leahy Scale: Good                                       Pertinent Vitals/Pain Pain Assessment: 0-10 Pain Location: patient reports generalized body aches but does not provide numerical value. Pain Descriptors / Indicators: Aching Pain Intervention(s): Limited activity within patient's tolerance    Home Living Family/patient expects to be discharged to:: Private residence Living Arrangements: Alone   Type of Home: House       Home Layout: Able to live on main level with bedroom/bathroom        Prior Function Level of Independence: Independent               Hand Dominance        Extremity/Trunk Assessment   Upper Extremity Assessment Upper Extremity Assessment: Defer to OT evaluation    Lower Extremity Assessment Lower Extremity Assessment: Generalized weakness       Communication   Communication: No difficulties  Cognition Arousal/Alertness: Awake/alert Behavior During Therapy:  WFL for tasks assessed/performed Overall Cognitive Status: Within Functional Limits for tasks assessed                                        General Comments      Exercises General Exercises - Lower Extremity Heel Slides: AROM;Strengthening;Both;10 reps;Supine Hip ABduction/ADduction: AROM;Strengthening;Both;10 reps;Supine Straight Leg Raises: AROM;Strengthening;Both;10 reps;Supine Other Exercises Other Exercises: verbal cues for technique. Sp02 remained 88-89% with bed level exercises    Assessment/Plan    PT Assessment Patient needs continued PT  services  PT Problem List Decreased strength;Decreased activity tolerance;Decreased balance;Decreased mobility;Decreased cognition;Decreased knowledge of use of DME       PT Treatment Interventions DME instruction;Gait training;Stair training;Functional mobility training;Therapeutic activities;Therapeutic exercise;Balance training;Neuromuscular re-education;Patient/family education    PT Goals (Current goals can be found in the Care Plan section)  Acute Rehab PT Goals Patient Stated Goal: to go home  PT Goal Formulation: With patient Time For Goal Achievement: 06/05/20 Potential to Achieve Goals: Good    Frequency Min 2X/week   Barriers to discharge        Co-evaluation               AM-PAC PT "6 Clicks" Mobility  Outcome Measure Help needed turning from your back to your side while in a flat bed without using bedrails?: None Help needed moving from lying on your back to sitting on the side of a flat bed without using bedrails?: None Help needed moving to and from a bed to a chair (including a wheelchair)?: A Little Help needed standing up from a chair using your arms (e.g., wheelchair or bedside chair)?: A Little Help needed to walk in hospital room?: A Little Help needed climbing 3-5 steps with a railing? : A Little 6 Click Score: 20    End of Session Equipment Utilized During Treatment: Oxygen Activity Tolerance: Patient limited by fatigue Patient left: in bed;with call bell/phone within reach Nurse Communication: Mobility status PT Visit Diagnosis: Repeated falls (R29.6);Muscle weakness (generalized) (M62.81);Difficulty in walking, not elsewhere classified (R26.2)    Time: 1012-1040 PT Time Calculation (min) (ACUTE ONLY): 28 min   Charges:   PT Evaluation $PT Eval High Complexity: 1 High PT Treatments $Therapeutic Exercise: 8-22 mins       Donna Bernard, PT, MPT   Ina Homes 05/22/2020, 2:04 PM

## 2020-05-22 NOTE — Evaluation (Signed)
Occupational Therapy Evaluation Patient Details Name: Jackson Stark MRN: [id number] DOB: [birth year] Today's Date: 05/22/2020    History of Present Illness Patient is a 59 y.o. male who presented from home for shortness of breath, dry cough, and and increasing weakness, recurrent falls. Work up revealed Acute hypoxic respiratory failure secondary to acute Covid-19 viral pneumonia, elevated D-dimer, Venous duplex vascular ultrasound bilateral lower extremities negative for DVT, acute renal familure, hypokalemia.  Medical history of  COPD, essential hypertension, and type 2 diabetes mellitus.    Clinical Impression   Mr Prehn was seen for OT evaluation this date. Prior to hospital admission, pt was Independent in mobility and ADLs. Pt presents to acute OT demonstrating impaired ADL performance and functional mobility 2/2 decreased activity tolerance, functional strength/balance deficits, and decreased safety awareness. Upon arrival pt noted to have [place] in mouth 2/2 nosebleed. SpO2 stable in mid 90s t/o convseration and seated EOB. Desat 86% on 11L HFNC c seated THEREX, resolved to mid 90s c return to supine. Pt currently requires SUPERVISION don/doff B socks seated EOB. MOD I seated grooming tasks. Pt deferred OOB mobility citing fatigue. Performed bed mobility MOD I and SUPERVISION for dynamic sitting balance and seated marches. Pt would benefit from skilled OT to address noted impairments and functional limitations (see below for any additional details) in order to maximize safety and independence while minimizing falls risk and caregiver burden. Upon hospital discharge, recommend HHOT to maximize pt safety and return to functional independence during meaningful occupations of daily life.     Follow Up Recommendations  Home health OT;Supervision - Intermittent    Equipment Recommendations  3 in 1 bedside commode    Recommendations for Other Services       Precautions / Restrictions  Precautions Precautions: Fall Restrictions Weight Bearing Restrictions: No      Mobility Bed Mobility Overal bed mobility: Modified Independent      General bed mobility comments: Increased time sup<>sit  Transfers    General transfer comment: Pt declined OOB mobility citing fatigue     Balance Overall balance assessment: Needs assistance;History of Falls Sitting-balance support: Feet supported;No upper extremity supported Sitting balance-Leahy Scale: Good        ADL either performed or assessed with clinical judgement   ADL Overall ADL's : Needs assistance/impaired      General ADL Comments: SUPERVISION don/doff B socks seated EOB. MOD I seated grooming tasks. Anticipate CGA for BSC t/f                   Pertinent Vitals/Pain Pain Assessment: No/denies pain Pain Location: patient reports generalized body aches but does not provide numerical value. Pain Descriptors / Indicators: Aching Pain Intervention(s): Limited activity within patient's tolerance     Hand Dominance     Extremity/Trunk Assessment Upper Extremity Assessment Upper Extremity Assessment: Overall WFL for tasks assessed   Lower Extremity Assessment Lower Extremity Assessment: Generalized weakness       Communication Communication Communication: No difficulties   Cognition Arousal/Alertness: Awake/alert Behavior During Therapy: WFL for tasks assessed/performed Overall Cognitive Status: Within Functional Limits for tasks assessed            General Comments  2/2 nosebleed, pt had [place] in mouth. SpO2 stable in mid 90s t/o convseration and seated EOB. Desat 86% on 11L HFNC c seated THEREX, resolved to mid 90s c return to supine    Exercises Exercises: Other exercises Other Exercises Other Exercises: Pt educated re: OT role, DME recs, d/c recs,  ECS, falls prevention, home/routines modifications, IS (frequency and use), HEP Other Exercises: LBD, sup<>sit, sitting balance/tolerance,  functional reach, bed mobility, seated marches   Shoulder Instructions      Home Living Family/patient expects to be discharged to:: Private residence Living Arrangements: Alone Available Help at Discharge: Family;Available PRN/intermittently Type of Home: House       Home Layout: Able to live on main level with bedroom/bathroom     Bathroom Shower/Tub: Walk-in shower               Prior Functioning/Environment Level of Independence: Independent                 OT Problem List: Decreased strength;Decreased activity tolerance;Impaired balance (sitting and/or standing);Decreased knowledge of use of DME or AE;Cardiopulmonary status limiting activity      OT Treatment/Interventions: Self-care/ADL training;Therapeutic exercise;Energy conservation;DME and/or AE instruction;Therapeutic activities;Patient/family education;Balance training    OT Goals(Current goals can be found in the care plan section) Acute Rehab OT Goals Patient Stated Goal: to go home  OT Goal Formulation: With patient Time For Goal Achievement: 06/05/20 Potential to Achieve Goals: Good ADL Goals Pt Will Perform Grooming: with modified independence;standing (c LRAD PRN) Pt Will Transfer to Toilet: with modified independence;ambulating;bedside commode (c LRAD PRN) Additional ADL Goal #1: Pt will Independently verbalize plan to implement x3 ECS  OT Frequency: Min 1X/week    AM-PAC OT "6 Clicks" Daily Activity     Outcome Measure Help from another person eating meals?: None Help from another person taking care of personal grooming?: A Little Help from another person toileting, which includes using toliet, bedpan, or urinal?: A Little Help from another person bathing (including washing, rinsing, drying)?: A Little Help from another person to put on and taking off regular upper body clothing?: None Help from another person to put on and taking off regular lower body clothing?: A Little 6 Click Score:  20   End of Session Equipment Utilized During Treatment: Oxygen (11L HFNC)  Activity Tolerance: Patient limited by fatigue;Patient tolerated treatment well Patient left: in bed;with call bell/phone within reach  OT Visit Diagnosis: Other abnormalities of gait and mobility (R26.89);History of falling (Z91.81)                Time: 7253-6644 OT Time Calculation (min): 10 min Charges:  OT General Charges $OT Visit: 1 Visit OT Evaluation $OT Eval Low Complexity: 1 Low OT Treatments $Self Care/Home Management : 8-22 mins  Kathie Dike, M.S. OTR/L  05/22/20, 2:44 PM  ascom [phone]

## 2020-05-23 DIAGNOSIS — E1365 Other specified diabetes mellitus with hyperglycemia: Secondary | ICD-10-CM | POA: Diagnosis not present

## 2020-05-23 DIAGNOSIS — N179 Acute kidney failure, unspecified: Secondary | ICD-10-CM | POA: Diagnosis not present

## 2020-05-23 DIAGNOSIS — U071 COVID-19: Secondary | ICD-10-CM | POA: Diagnosis not present

## 2020-05-23 DIAGNOSIS — J9601 Acute respiratory failure with hypoxia: Secondary | ICD-10-CM | POA: Diagnosis not present

## 2020-05-23 LAB — CBC
HCT: 27.7 % — ABNORMAL LOW (ref 39.0–52.0)
Hemoglobin: 9.7 g/dL — ABNORMAL LOW (ref 13.0–17.0)
MCH: 28.4 pg (ref 26.0–34.0)
MCHC: 35 g/dL (ref 30.0–36.0)
MCV: 81.2 fL (ref 80.0–100.0)
Platelets: 208 10*3/uL (ref 150–400)
RBC: 3.41 MIL/uL — ABNORMAL LOW (ref 4.22–5.81)
RDW: 13.1 % (ref 11.5–15.5)
WBC: 24.8 10*3/uL — ABNORMAL HIGH (ref 4.0–10.5)
nRBC: 0 % (ref 0.0–0.2)

## 2020-05-23 LAB — GLUCOSE, CAPILLARY
Glucose-Capillary: 84 mg/dL (ref 70–99)
Glucose-Capillary: 148 mg/dL — ABNORMAL HIGH (ref 70–99)
Glucose-Capillary: 433 mg/dL — ABNORMAL HIGH (ref 70–99)
Glucose-Capillary: 304 mg/dL — ABNORMAL HIGH (ref 70–99)

## 2020-05-23 LAB — FIBRIN DERIVATIVES D-DIMER (ARMC ONLY): Fibrin derivatives D-dimer (ARMC): 1742.41 ng/mL (FEU) — ABNORMAL HIGH (ref 0.00–499.00)

## 2020-05-23 LAB — BASIC METABOLIC PANEL
Anion gap: 10 (ref 5–15)
BUN: 104 mg/dL — ABNORMAL HIGH (ref 6–20)
CO2: 26 mmol/L (ref 22–32)
Calcium: 8 mg/dL — ABNORMAL LOW (ref 8.9–10.3)
Chloride: 95 mmol/L — ABNORMAL LOW (ref 98–111)
Creatinine, Ser: 4.12 mg/dL — ABNORMAL HIGH (ref 0.61–1.24)
GFR calc Af Amer: 17 mL/min — ABNORMAL LOW (ref >60)
GFR calc non Af Amer: 15 mL/min — ABNORMAL LOW (ref >60)
Glucose, Bld: 113 mg/dL — ABNORMAL HIGH (ref 70–99)
Potassium: 3.6 mmol/L (ref 3.5–5.1)
Sodium: 131 mmol/L — ABNORMAL LOW (ref 135–145)

## 2020-05-23 LAB — C-REACTIVE PROTEIN: CRP: 1.7 mg/dL — ABNORMAL HIGH (ref <1.0)

## 2020-05-23 MED ORDER — INSULIN GLARGINE 100 UNIT/ML ~~LOC~~ SOLN
55.0000 [IU] | Freq: Two times a day (BID) | SUBCUTANEOUS | Status: DC
  Administered 2020-05-23 – 2020-05-24 (×3): 55 [IU] via SUBCUTANEOUS
  Filled 2020-05-23 (×5): qty 0.55

## 2020-05-23 MED ORDER — CARVEDILOL 12.5 MG PO TABS
12.5000 mg | ORAL_TABLET | Freq: Two times a day (BID) | ORAL | Status: DC
  Administered 2020-05-23 – 2020-05-24 (×4): 12.5 mg via ORAL
  Filled 2020-05-23 (×4): qty 4

## 2020-05-23 MED ORDER — GLUCERNA SHAKE PO LIQD
237.0000 mL | Freq: Three times a day (TID) | ORAL | Status: DC
  Administered 2020-05-23 – 2020-05-24 (×3): 237 mL via ORAL

## 2020-05-23 MED ORDER — INSULIN ASPART 100 UNIT/ML ~~LOC~~ SOLN
5.0000 [IU] | Freq: Three times a day (TID) | SUBCUTANEOUS | Status: DC
  Administered 2020-05-23 – 2020-05-24 (×4): 5 [IU] via SUBCUTANEOUS
  Filled 2020-05-23 (×3): qty 1

## 2020-05-23 MED ORDER — INSULIN ASPART 100 UNIT/ML ~~LOC~~ SOLN: 12.0000 [IU] | Freq: Three times a day (TID) | SUBCUTANEOUS | Status: DC

## 2020-05-23 NOTE — Progress Notes (Signed)
Inpatient Diabetes Program Recommendations  AACE/ADA: New Consensus Statement on Inpatient Glycemic Control   Target Ranges:  Prepandial:   less than 140 mg/dL      Peak postprandial:   less than 180 mg/dL (1-2 hours)      Critically ill patients:  140 - 180 mg/dL   Results for Jackson Stark, Jackson Stark (MRN [id number]) as of 05/23/2020 12:59  Ref. Range 05/22/2020 07:38 05/22/2020 12:14 05/22/2020 16:24 05/22/2020 21:17 05/23/2020 07:42 05/23/2020 11:58  Glucose-Capillary Latest Ref Range: 70 - 99 mg/dL 409 (H) 811 (H) 914 (H) 292 (H) 84 148 (H)  Results for Jackson Stark, Jackson Stark (MRN [id number]) as of 05/23/2020 12:59  Ref. Range 05/16/2020 06:01  Hemoglobin A1C Latest Ref Range: 4.8 - 5.6 % 13.8 (H)   Review of Glycemic Control  Diabetes history: No Outpatient Diabetes medications: NA Current orders for Inpatient glycemic control: Lantus 55 units BID, Novolog 5 units TID with meals, Novolog 0-20 units TID with meals, Novolog 0-5 units QHS; Solumedrol 60 mg Q12H  NOTE: Glucose today 84 mg/dl fasting and 086 mg/dl at 57:84 am. Spoke with patient over the phone regarding new DM dx and insulin. Patient states that he has not seen a provider in over a year and he will need assistance with getting a local PCP. Will ask TOC to assist with arranging follow up. Patient reports that he has the Living Well with DM book and he has been looking over the book. Patient states that he is overwhelmed with everything going on and he notes he is feeling tired and weak. Reviewed DM basics for self management, glucose and A1C goals, hypoglycemia, and treatment for hypoglycemia. Patient reports that his glucose has been as low as 74 mg/dl that he is aware of and he did not have any symptoms of hypoglycemia when his glucose was that low. Patient is aware of current A1C of 13.8% and that his goal A1C is 7%. Patient states that he understands that steroids are contributing to hyperglycemia and that his glucose may continue to be elevated  at home if he goes home on steroids. Inquired about insulin teaching and patient states that bedside nursing has been educating him on insulin injections and he has been self administer insulin shots mainly in the abdomen. Discussed insulin injection sites and importance of rotating injection sites. Discussed Lantus and Novolog insulin as current ordered. Patient inquired about dosages he will take at home. Explained that MD will specify dosages on discharge paperwork when he is ready to be discharged. Asked patient to check glucose 4 times per day and to take written log of glucose readings or glucometer with him to follow up appointments so the provider can use the information to continue to adjust DM medications if needed. Patient verbalized understanding of information discussed and he states he has no questions at this time related to DM or insulin. Will continue to follow along.  Thanks, Orlando Penner, RN, MSN, CDE Diabetes Coordinator Inpatient Diabetes Program [phone] (Team Pager from 8am to 5pm)

## 2020-05-23 NOTE — Consult Note (Signed)
PHARMACY CONSULT NOTE - FOLLOW UP  Pharmacy Consult for Electrolyte Monitoring and Replacement   Recent Labs: Potassium (mmol/L)  Date Value  05/23/2020 3.6   Magnesium (mg/dL)  Date Value  95/32/0233 2.2   Calcium (mg/dL)  Date Value  43/56/8616 8.0 (L)   Albumin (g/dL)  Date Value  83/72/9021 2.2 (L)   Sodium (mmol/L)  Date Value  05/23/2020 131 (L)     Assessment: NathanTidwellis a58 y.o.malewith a known history of COPD, hypertension and type 2 diabetes mellitus, who presented to the emergency room with acute onset of worsening dyspnea/COVID PNA and hyperglycemia.  BG currently 191 on prednisone, SSI, and glargine 25 units bid.  Pharmacy has been consulted to monitor/replenish K.   Goal of Therapy:  K ~ 4  Plan:  K 3.6  Mag 2.2 (10/3)  Scr 4.12 . No replacement at this time. Conservative due to renal fxn. If trends down again consider replacing K with low dose. Will follow up on AM labs.  Jackson Stark, PharmD, BCPS 05/23/2020 10:41 AM

## 2020-05-23 NOTE — Progress Notes (Signed)
[facility]  ROUNDING NOTE   Subjective:   Patient found ambulating back to bed from the bathroom, dyspneic on exertion, continuing on supplemental O2 via high flow nasal canula,otherwise no complaints. Denies nausea,vomiting.  10/03 0701 - 10/04 0700 In: 474 [P.O.:474] Out: 1725 [Urine:1725]  Lab Results  Component Value Date   CREATININE 4.12 (H) 05/23/2020   CREATININE 4.21 (H) 05/22/2020   CREATININE 4.15 (H) 05/21/2020     Objective:  Vital signs in last 24 hours:  Temp:  [96.9 F (36.1 C)-98.1 F (36.7 C)] 97.2 F (36.2 C) (10/04 0823) Pulse Rate:  [73-93] 73 (10/04 0823) Resp:  [16-19] 16 (10/04 0823) BP: (133-161)/(51-71) 149/70 (10/04 0823) SpO2:  [91 %-98 %] 91 % (10/04 0823)  Weight change:  Filed Weights   06/12/2020 1707 05/16/20 1539  Weight: 77.1 kg 71.7 kg    Intake/Output: I/O last 3 completed shifts: In: 474 [P.O.:474] Out: 2025 [Urine:2025]   Intake/Output this shift:  Total I/O In: -  Out: 450 [Urine:450]  Physical Exam: General: Calm,Cooperative, in no acute distress  Head: Normocephalic,Atraumatic,High flow nasal canula in place  Eyes: Anicteric  Lungs:   Clear bilaterally  Heart:  S1,S2 no rubs or gallops  Abdomen:  Nontender, non distended  Extremities: No  peripheral edema.  Neurologic: Alert, oriented x3  Skin: No acute rashes or  lesions       Basic Metabolic Panel: Recent Labs  Lab 05/19/20 0320 05/19/20 0320 05/20/20 0359 05/20/20 0359 05/21/20 0540 05/22/20 0401 05/23/20 0432  NA 137  --  137  --  132* 132* 131*  K 3.0*  --  3.6  --  3.8 3.7 3.6  CL 101  --  103  --  98 97* 95*  CO2 22  --  23  --  22 25 26   GLUCOSE 126*  --  209*  --  311* 226* 113*  BUN 103*  --  99*  --  97* 97* 104*  CREATININE 4.41*  --  4.11*  --  4.15* 4.21* 4.12*  CALCIUM 8.1*   < > 8.0*   < > 8.3* 8.0* 8.0*  MG 2.0  --  2.2  --  2.3 2.2  --    < > = values in this interval not displayed.    Liver Function Tests: Recent  Labs  Lab 05/17/20 0541 05/18/20 0538 05/19/20 0320 05/20/20 0359  AST 26 21 25  37  ALT 14 16 15 22   ALKPHOS 69 72 74 69  BILITOT 0.9 0.8 0.7 0.7  PROT 5.7* 5.2* 5.3* 5.1*  ALBUMIN 2.2* 2.1* 2.2* 2.2*   No results for input(s): LIPASE, AMYLASE in the last 168 hours. No results for input(s): AMMONIA in the last 168 hours.  CBC: Recent Labs  Lab 05/17/20 0541 05/17/20 0541 05/18/20 0538 05/18/20 0538 05/19/20 0320 05/20/20 0359 05/21/20 0540 05/22/20 0401 05/23/20 0432  WBC 5.1   < > 5.5   < > 8.0 8.0 6.8 9.9 24.8*  NEUTROABS 4.4  --  5.0  --  7.3 6.8  --   --   --   HGB 11.6*   < > 10.7*   < > 10.9* 10.8* 10.5* 9.7* 9.7*  HCT 31.2*   < > 28.8*   < > 28.6* 29.1* 28.4* 28.1* 27.7*  MCV 77.6*   < > 78.3*   < > 75.9* 77.6* 77.8* 81.7 81.2  PLT 190   < > 210   < > 194 191 225 218  208   < > = values in this interval not displayed.    Cardiac Enzymes: No results for input(s): CKTOTAL, CKMB, CKMBINDEX, TROPONINI in the last 168 hours.  BNP: Invalid input(s): POCBNP  CBG: Recent Labs  Lab 05/22/20 0738 05/22/20 1214 05/22/20 1624 05/22/20 2117 05/23/20 0742  GLUCAP 227* 335* 330* 292* 84    Microbiology: Results for orders placed or performed during the hospital encounter of 05/04/2020  Respiratory Panel by RT PCR (Flu A&B, Covid) - Nasopharyngeal Swab     Status: Abnormal   Collection Time: 05/02/2020  5:14 PM   Specimen: Nasopharyngeal Swab  Result Value Ref Range Status   SARS Coronavirus 2 by RT PCR POSITIVE (A) NEGATIVE Final    Comment: RESULT CALLED TO, READ BACK BY AND VERIFIED WITH: LORRIE LEMONS ON 05/04/2020 AT 1851 QSD (NOTE) SARS-CoV-2 target nucleic acids are DETECTED.  SARS-CoV-2 RNA is generally detectable in upper respiratory specimens  during the acute phase of infection. Positive results are indicative of the presence of the identified virus, but do not rule out bacterial infection or co-infection with other pathogens not detected by the test.  Clinical correlation with patient history and other diagnostic information is necessary to determine patient infection status. The expected result is Negative.  Fact Sheet for Patients:  https://www.moore.com/  Fact Sheet for Healthcare Providers: https://www.young.biz/  This test is not yet approved or cleared by the [place] FDA and  has been authorized for detection and/or diagnosis of SARS-CoV-2 by FDA under an Emergency Use Authorization (EUA).  This EUA will remain in effect (meaning this test can b e used) for the duration of  the COVID-19 declaration under Section 564(b)(1) of the Act, 21 U.S.C. section 360bbb-3(b)(1), unless the authorization is terminated or revoked sooner.      Influenza A by PCR NEGATIVE NEGATIVE Final   Influenza B by PCR NEGATIVE NEGATIVE Final    Comment: (NOTE) The Xpert Xpress SARS-CoV-2/FLU/RSV assay is intended as an aid in  the diagnosis of influenza from Nasopharyngeal swab specimens and  should not be used as a sole basis for treatment. Nasal washings and  aspirates are unacceptable for Xpert Xpress SARS-CoV-2/FLU/RSV  testing.  Fact Sheet for Patients: https://www.moore.com/  Fact Sheet for Healthcare Providers: https://www.young.biz/  This test is not yet approved or cleared by the [place] FDA and  has been authorized for detection and/or diagnosis of SARS-CoV-2 by  FDA under an Emergency Use Authorization (EUA). This EUA will remain  in effect (meaning this test can be used) for the duration of the  Covid-19 declaration under Section 564(b)(1) of the Act, 21  U.S.C. section 360bbb-3(b)(1), unless the authorization is  terminated or revoked. Performed at [facility], [address] [place], [place] [address]   Blood culture (routine x 2)     Status: None   Collection Time: 05/12/2020  5:14 PM   Specimen: BLOOD  Result Value Ref Range  Status   Specimen Description BLOOD LEFT ARM  Final   Special Requests   Final    BOTTLES DRAWN AEROBIC AND ANAEROBIC Blood Culture adequate volume   Culture   Final    NO GROWTH 5 DAYS Performed at [facility], [address] [place], [place] [address]    Report Status 05/20/2020 FINAL  Final  Blood culture (routine x 2)     Status: None   Collection Time: 05/03/2020  5:14 PM   Specimen: BLOOD  Result Value Ref Range Status  Specimen Description BLOOD RIGHT ARM  Final   Special Requests   Final    BOTTLES DRAWN AEROBIC AND ANAEROBIC Blood Culture adequate volume   Culture   Final    NO GROWTH 5 DAYS Performed at [facility], [address] [place], [place] [address]    Report Status 05/20/2020 FINAL  Final    Coagulation Studies: No results for input(s): LABPROT, INR in the last 72 hours.  Urinalysis: No results for input(s): COLORURINE, LABSPEC, PHURINE, GLUCOSEU, HGBUR, BILIRUBINUR, KETONESUR, PROTEINUR, UROBILINOGEN, NITRITE, LEUKOCYTESUR in the last 72 hours.  Invalid input(s): APPERANCEUR    Imaging: No results found.   Medications:    . amLODipine  10 mg Oral Daily  . vitamin C  500 mg Oral Daily  . aspirin EC  81 mg Oral Daily  . carvedilol  12.5 mg Oral BID WC  . enoxaparin (LOVENOX) injection  1 mg/kg Subcutaneous Q24H  . famotidine  10 mg Oral Daily  . feeding supplement (NEPRO CARB STEADY)  237 mL Oral TID BM  . fluticasone  2 spray Each Nare Daily  . guaiFENesin  600 mg Oral BID  . hydrALAZINE  100 mg Oral TID  . influenza vac split quadrivalent PF  0.5 mL Intramuscular Tomorrow-1000  . insulin aspart  0-20 Units Subcutaneous TID WC  . insulin aspart  0-5 Units Subcutaneous QHS  . insulin aspart  12 Units Subcutaneous TID WC  . insulin glargine  55 Units Subcutaneous BID  . linagliptin  5 mg Oral Daily  . methylPREDNISolone (SOLU-MEDROL) injection  60 mg Intravenous Q12H  . multivitamin with minerals  1 tablet Oral Daily  .  pneumococcal 23 valent vaccine  0.5 mL Intramuscular Tomorrow-1000  . zinc sulfate  220 mg Oral Daily   acetaminophen, chlorpheniramine-HYDROcodone, guaiFENesin-dextromethorphan, magnesium hydroxide, ondansetron **OR** ondansetron (ZOFRAN) IV, sodium chloride, traZODone  Assessment/ Plan:  Mr. Jackson Stark is a 59 y.o.  male presented from home for shortness of breath and increasing weakness.  Hypoxic on room air as per EMS.  Diagnosed with Covid pneumonia.  He has recurrent falls, nasal congestion.  Lab results showed acidosis, hyponatremia, hypokalemia, increased creatinine, low albumin.h/o diabetes for 20 years, was not compliant with medications for the past one year.  #Acute kidney injury Lab Results  Component Value Date   CREATININE 4.12 (H) 05/23/2020   CREATININE 4.21 (H) 05/22/2020   CREATININE 4.15 (H) 05/21/2020   Urine output 1725 for the preceding 24 hours creatinine still elevated but trending down today, 4.12.  We will continue monitoring  #Diabetes type 2, poorly controlled Last HbA1C: 13.8 on 05/16/20 Blood sugar readings within acceptable range, we will continue current antihyperglycemic regimen  #Multifocal pneumonia secondary to COVID-19 Patient still has dyspnea on exertion oxygen continued. He is on Solu-Medrol.   #Hypokalemia Resolved, potassium today is 3.6     LOS: 8 Jackson Stark 10/4/202110:33 AM

## 2020-05-23 NOTE — Progress Notes (Signed)
Pt drinking chocolate milk shake from cook out. Secretary ask to put note any outside food must be approved by RN. CBG will continue to be monitored as scheduled with SSI and basal units as ordered.

## 2020-05-23 NOTE — Discharge Instructions (Signed)
Carbohydrate Counting for People with Diabetes Foods with carbohydrates make your blood glucose level go up. Learning how to count carbohydrates can help you control your blood glucose levels. First, identify the foods you eat that contain carbohydrates. Then, using the Foods with Carbohydrates chart, determine about how much carbohydrates are in your meals and snacks. Make sure you are eating foods with fiber, protein, and healthy fat along with your carbohydrate foods. Foods with Carbohydrates The following table shows carbohydrate foods that have about 15 grams of carbohydrate each. Using measuring cups, spoons, or a food scale when you first begin learning about carbohydrate counting can help you learn about the portion sizes you typically eat. The following foods have 15 grams carbohydrate each:  Grains 1 slice bread (1 ounce)  1 small tortilla (6-inch size)   large bagel (1 ounce)  1/3 cup pasta or rice (cooked)   hamburger or hot dog bun ( ounce)   cup cooked cereal   to  cup ready-to-eat cereal  2 taco shells (5-inch size) Fruit 1 small fresh fruit ( to 1 cup)   medium banana  17 small grapes (3 ounces)  1 cup melon or berries   cup canned or frozen fruit  2 tablespoons dried fruit (blueberries, cherries, cranberries, raisins)   cup unsweetened fruit juice  Starchy Vegetables  cup cooked beans, peas, corn, potatoes/sweet potatoes   large baked potato (3 ounces)  1 cup acorn or butternut squash  Snack Foods 3 to 6 crackers  8 potato chips or 13 tortilla chips ( ounce to 1 ounce)  3 cups popped popcorn  Dairy 3/4 cup (6 ounces) nonfat plain yogurt, or yogurt with sugar-free sweetener  1 cup milk  1 cup plain rice, soy, coconut or flavored almond milk Sweets and Desserts  cup ice cream or frozen yogurt  1 tablespoon jam, jelly, pancake syrup, table sugar, or honey  2 tablespoons light pancake syrup  1 inch square of frosted cake or 2 inch square of unfrosted cake   2 small cookies (2/3 ounce each) or  large cookie  Sometimes you'll have to estimate carbohydrate amounts if you don't know the exact recipe. One cup of mixed foods like soups can have 1 to 2 carbohydrate servings, while some casseroles might have 2 or more servings of carbohydrate. Foods that have less than 20 calories in each serving can be counted as "free" foods. Count 1 cup raw vegetables, or  cup cooked non-starchy vegetables as "free" foods. If you eat 3 or more servings at one meal, then count them as 1 carbohydrate serving.  Foods without Carbohydrates  Not all foods contain carbohydrates. Meat, some dairy, fats, non-starchy vegetables, and many beverages don't contain carbohydrate. So when you count carbohydrates, you can generally exclude chicken, pork, beef, fish, seafood, eggs, tofu, cheese, butter, sour cream, avocado, nuts, seeds, olives, mayonnaise, water, black coffee, unsweetened tea, and zero-calorie drinks. Vegetables with no or low carbohydrate include green beans, cauliflower, tomatoes, and onions. How much carbohydrate should I eat at each meal?  Carbohydrate counting can help you plan your meals and manage your weight. Following are some starting points for carbohydrate intake at each meal. Work with your registered dietitian nutritionist to find the best range that works for your blood glucose and weight.   To Lose Weight To Maintain Weight  Women 2 - 3 carb servings 3 - 4 carb servings  Men 3 - 4 carb servings 4 - 5 carb servings  Checking your blood  glucose after meals will help you know if you need to adjust the timing, type, or number of carbohydrate servings in your meal plan. Achieve and keep a healthy body weight by balancing your food intake and physical activity.  Tips How should I plan my meals?  Plan for half the food on your plate to include non-starchy vegetables, like salad greens, broccoli, or carrots. Try to eat 3 to 5 servings of non-starchy vegetables  every day. Have a protein food at each meal. Protein foods include chicken, fish, meat, eggs, or beans (note that beans contain carbohydrate). These two food groups (non-starchy vegetables and proteins) are low in carbohydrate. If you fill up your plate with these foods, you will eat less carbohydrate but still fill up your stomach. Try to limit your carbohydrate portion to  of the plate.  What fats are healthiest to eat?  Diabetes increases risk for heart disease. To help protect your heart, eat more healthy fats, such as olive oil, nuts, and avocado. Eat less saturated fats like butter, cream, and high-fat meats, like bacon and sausage. Avoid trans fats, which are in all foods that list "partially hydrogenated oil" as an ingredient. What should I drink?  Choose drinks that are not sweetened with sugar. The healthiest choices are water, carbonated or seltzer waters, and tea and coffee without added sugars.  Sweet drinks will make your blood glucose go up very quickly. One serving of soda or energy drink is  cup. It is best to drink these beverages only if your blood glucose is low.  Artificially sweetened, or diet drinks, typically do not increase your blood glucose if they have zero calories in them. Read labels of beverages, as some diet drinks do have carbohydrate and will raise your blood glucose. Label Reading Tips Read Nutrition Facts labels to find out how many grams of carbohydrate are in a food you want to eat. Don't forget: sometimes serving sizes on the label aren't the same as how much food you are going to eat, so you may need to calculate how much carbohydrate is in the food you are serving yourself.   Carbohydrate Counting for People with Diabetes Sample 1-Day Menu  Breakfast  cup yogurt, low fat, low sugar (1 carbohydrate serving)   cup cereal, ready-to-eat, unsweetened (1 carbohydrate serving)  1 cup strawberries (1 carbohydrate serving)   cup almonds   Lunch 1, 5 ounce can  chunk light tuna  2 ounces cheese, low fat cheddar  6 whole wheat crackers (1 carbohydrate serving)  1 small apple (1 carbohydrate servings)   cup carrots   cup snap peas  1 cup 1% milk (1 carbohydrate serving)   Evening Meal Stir fry made with: 3 ounces chicken  1 cup brown rice (3 carbohydrate servings)   cup broccoli ( carbohydrate serving)   cup green beans   cup onions  1 tablespoon olive oil  2 tablespoons teriyaki sauce ( carbohydrate serving)  Evening Snack 1 extra small banana (1 carbohydrate serving)  1 tablespoon peanut butter

## 2020-05-23 NOTE — Progress Notes (Signed)
PROGRESS NOTE    Jackson Stark  IWP:[id number] DOB: March 17, 1961 DOA: 05/09/2020 PCP: Patient, No Pcp Per    Brief Narrative:  Jackson Stark is a 59 year old male with past medical history notable for COPD, essential hypertension, type 2 diabetes mellitus who presented to the emergency department with progressive shortness of breath associated with dry cough and generalized weakness.  Also with recurrent falls.  In the ED, patient was noted to be hypoxic with SPO2 80% on room air with improvement to 93% on 10 L NRB.  Creatinine 5.78, anion gap 18, BNP 80.5.  Chest x-ray notable for left basal infiltrate.  Patient was started on IV Decadron and doxycycline and given 2 L bolus of normal saline.  TRH consulted for admission.   Assessment & Plan:   Active Problems:   Acute hypoxemic respiratory failure due to COVID-19 [facility])   Malnutrition of moderate degree   AKI (acute kidney injury) (HCC)   Generalized weakness   DM (diabetes mellitus), secondary uncontrolled (HCC)   Acute respiratory failure with hypoxia (HCC)   Acute hypoxic respiratory failure secondary to acute Covid-19 viral pneumonia during the ongoing 2020 Covid 19 Pandemic - POA Patient presenting to ED with progressive shortness of breath.  Was found to be hypoxic with SPO2 80% on room air on arrival.  Chest x-ray consistent with Covid-19 viral pneumonia; with repeat x-ray on 05/20/2020 showing worsening progression of multifocal pneumonia. --COVID test: + 05/14/2020 --CRP 8.7>6.2>4.5>3.0>2.6>7.4>3.8> labs pending this morning --ddimer 1,079>[id number]>[id number]>2049>1592>1742 --Completed 5-day course of remdesivir on 05/19/2020 --Unable to start baricitinib 2/2 renal insufficiency/acute renal failure --Continue Solu-Medrol 60 mg IV q12h --prone for 2-3hrs every 12hrs if able --Continue supplemental oxygen, titrate to maintain SPO2 greater than 85%, on 9L HFNC --Continue supportive care with albuterol MDI prn, vitamin C, zinc,  Tylenol, antitussives (benzonatate/ Mucinex/Tussionex) --Follow CBC, CMP, D-dimer, ferritin, and CRP daily --Continue airborne/contact isolation precautions for 3 weeks from the day of diagnosis  The treatment plan and use of medications and known side effects were discussed with patient/family. Some of the medications used are based on case reports/anecdotal data.  All other medications being used in the management of COVID-19 based on limited study data.  Complete risks and long-term side effects are unknown, however in the best clinical judgment they seem to be of some benefit.  Patient wanted to proceed with treatment options provided.  Elevated D-dimer D-dimer now trending up despite treatment for Covid-19, [id number]>[id number]>2118.  Concern of blood clot whether DVT versus PE given known hypercoagulable state with Covid-19 infection coupled with poor mobility.  Unable to obtain CT angiogram chest due to renal failure.  Unable to obtain nuclear medicine VQ scan due to patient's inability to lay flat for 20 minutes with resultant worsening of his hypoxia.  Venous duplex vascular ultrasound bilateral lower extremities negative for DVT. --Started treatment dose Lovenox 10/1 --Continue to follow D-dimer daily  Acute renal failure Unknown baseline, poor outpatient follow-up/medical noncompliance.  Presented with creatinine 5.59.  Renal ultrasound with no acute abnormality. --Nephrology following, appreciate assistance --Cr 5.59>4.41>4.11>4.15>4.21>4.12 --Discontinued IV fluids on 10/1 due to worsening hypoxemia --Avoid nephrotoxins, renally dose all medications --Close monitoring of urinary output --Follow BMP daily  Hypokalemia Potassium 3.6 today --Repeat electrolytes in a.m. to include magnesium  Type 2 diabetes mellitus Hemoglobin A1c 13.8, poorly controlled. --Tradjenta 5 mg p.o. daily --Increase Lantus to 55 units [place] BID --NovoLog 12 units TID AC --Resistant insulin sliding scale for  coverage --CBGs qAC/HS  Essential hypertension BP 137/61  this morning. (range 133/51 - 161/67 past 24h) --amlodipine 10 mg p.o. daily --Hydralazine to 100 mg p.o. TID --Increase Coreg to 12.5mg  BID --Continue monitor BP closely and adjust as needed  Tobacco use disorder Counseled.   DVT prophylaxis: Lovenox Code Status: Full code Family Communication: Attempted to update patient's fianc Felicia via telephone this morning, unsuccessful with no answer  Disposition Plan:  Status is: Inpatient  Remains inpatient appropriate because:Persistent severe electrolyte disturbances, Ongoing diagnostic testing needed not appropriate for outpatient work up, Unsafe d/c plan, IV treatments appropriate due to intensity of illness or inability to take PO and Inpatient level of care appropriate due to severity of illness   Dispo: The patient is from: Home              Anticipated d/c is to: Home              Anticipated d/c date is: > 3 days              Patient currently is not medically stable to d/c.  Continues with high FiO2 requirements   Consultants:   Nephrology  Procedures:   Renal ultrasound  Vascular duplex venous ultrasound bilateral lower extremities  Antimicrobials:   None   Subjective: Patient seen and examined bedside, resting comfortably.  Continues on 9L high flow nasal cannula, that was actually out of his mouth as he was eating breakfast with SPO2 87%.  Continues with slow progression to wean from high flow nasal oxygen.  No other questions or concerns at this time. Denies headache, no visual changes, no chest pain, no dizziness, no palpitations, no fever/chills/night sweats, no nausea/vomiting/diarrhea, no fatigue, no abdominal pain, no weakness.  No acute concerns per nursing staff at this time.   Objective: Vitals:   05/22/20 2029 05/23/20 0047 05/23/20 0516 05/23/20 0823  BP: (!) 141/62 (!) 133/51 137/61 (!) 149/70  Pulse: 89 91 91 73  Resp: 18 16 16 16     Temp: 97.8 F (36.6 C) (!) 97.3 F (36.3 C) (!) 97.5 F (36.4 C) (!) 97.2 F (36.2 C)  TempSrc: Oral Oral Oral   SpO2: 97% 97% 98% 91%  Weight:      Height:        Intake/Output Summary (Last 24 hours) at 05/23/2020 1043 Last data filed at 05/23/2020 07/23/2020 Gross per 24 hour  Intake 237 ml  Output 2175 ml  Net -1938 ml   Filed Weights   05/13/2020 1707 05/16/20 1539  Weight: 77.1 kg 71.7 kg    Examination:  General exam: Appears calm and comfortable, chronically ill in appearance Respiratory system: Decreased breath sounds bilateral bases, no wheezing/rhonchi, normal respiratory effort without accessory muscle use, on 9L high flow nasal cannula Cardiovascular system: S1 & S2 heard, RRR. No JVD, murmurs, rubs, gallops or clicks. No pedal edema. Gastrointestinal system: Abdomen is nondistended, soft and nontender. No organomegaly or masses felt. Normal bowel sounds heard. Central nervous system: Alert and oriented. No focal neurological deficits. Extremities: Symmetric 5 x 5 power. Skin: No rashes, lesions or ulcers Psychiatry: Judgement and insight appear poor. Mood & affect appropriate.     Data Reviewed: I have personally reviewed following labs and imaging studies  CBC: Recent Labs  Lab 05/17/20 0541 05/17/20 0541 05/18/20 0538 05/18/20 0538 05/19/20 0320 05/20/20 0359 05/21/20 0540 05/22/20 0401 05/23/20 0432  WBC 5.1   < > 5.5   < > 8.0 8.0 6.8 9.9 24.8*  NEUTROABS 4.4  --  5.0  --  7.3 6.8  --   --   --   HGB 11.6*   < > 10.7*   < > 10.9* 10.8* 10.5* 9.7* 9.7*  HCT 31.2*   < > 28.8*   < > 28.6* 29.1* 28.4* 28.1* 27.7*  MCV 77.6*   < > 78.3*   < > 75.9* 77.6* 77.8* 81.7 81.2  PLT 190   < > 210   < > 194 191 225 218 208   < > = values in this interval not displayed.   Basic Metabolic Panel: Recent Labs  Lab 05/19/20 0320 05/20/20 0359 05/21/20 0540 05/22/20 0401 05/23/20 0432  NA 137 137 132* 132* 131*  K 3.0* 3.6 3.8 3.7 3.6  CL 101 103 98 97* 95*   CO2 22 23 22 25 26   GLUCOSE 126* 209* 311* 226* 113*  BUN 103* 99* 97* 97* 104*  CREATININE 4.41* 4.11* 4.15* 4.21* 4.12*  CALCIUM 8.1* 8.0* 8.3* 8.0* 8.0*  MG 2.0 2.2 2.3 2.2  --    GFR: Estimated Creatinine Clearance: 20.1 mL/min (A) (by C-G formula based on SCr of 4.12 mg/dL (H)). Liver Function Tests: Recent Labs  Lab 05/17/20 0541 05/18/20 0538 05/19/20 0320 05/20/20 0359  AST 26 21 25  37  ALT 14 16 15 22   ALKPHOS 69 72 74 69  BILITOT 0.9 0.8 0.7 0.7  PROT 5.7* 5.2* 5.3* 5.1*  ALBUMIN 2.2* 2.1* 2.2* 2.2*   No results for input(s): LIPASE, AMYLASE in the last 168 hours. No results for input(s): AMMONIA in the last 168 hours. Coagulation Profile: No results for input(s): INR, PROTIME in the last 168 hours. Cardiac Enzymes: No results for input(s): CKTOTAL, CKMB, CKMBINDEX, TROPONINI in the last 168 hours. BNP (last 3 results) No results for input(s): PROBNP in the last 8760 hours. HbA1C: No results for input(s): HGBA1C in the last 72 hours. CBG: Recent Labs  Lab 05/22/20 0738 05/22/20 1214 05/22/20 1624 05/22/20 2117 05/23/20 0742  GLUCAP 227* 335* 330* 292* 84   Lipid Profile: No results for input(s): CHOL, HDL, LDLCALC, TRIG, CHOLHDL, LDLDIRECT in the last 72 hours. Thyroid Function Tests: No results for input(s): TSH, T4TOTAL, FREET4, T3FREE, THYROIDAB in the last 72 hours. Anemia Panel: No results for input(s): VITAMINB12, FOLATE, FERRITIN, TIBC, IRON, RETICCTPCT in the last 72 hours. Sepsis Labs: No results for input(s): PROCALCITON, LATICACIDVEN in the last 168 hours.  Recent Results (from the past 240 hour(s))  Respiratory Panel by RT PCR (Flu A&B, Covid) - Nasopharyngeal Swab     Status: Abnormal   Collection Time: 11-19-2019  5:14 PM   Specimen: Nasopharyngeal Swab  Result Value Ref Range Status   SARS Coronavirus 2 by RT PCR POSITIVE (A) NEGATIVE Final    Comment: RESULT CALLED TO, READ BACK BY AND VERIFIED WITH: LORRIE LEMONS ON 08-15-20 AT 1851  QSD (NOTE) SARS-CoV-2 target nucleic acids are DETECTED.  SARS-CoV-2 RNA is generally detectable in upper respiratory specimens  during the acute phase of infection. Positive results are indicative of the presence of the identified virus, but do not rule out bacterial infection or co-infection with other pathogens not detected by the test. Clinical correlation with patient history and other diagnostic information is necessary to determine patient infection status. The expected result is Negative.  Fact Sheet for Patients:  https://www.moore.com/https://www.fda.gov/media/142436/download  Fact Sheet for Healthcare Providers: https://www.young.biz/https://www.fda.gov/media/142435/download  This test is not yet approved or cleared by the [place] FDA and  has been authorized for detection and/or diagnosis of SARS-CoV-2 by  FDA under an Emergency Use Authorization (EUA).  This EUA will remain in effect (meaning this test can b e used) for the duration of  the COVID-19 declaration under Section 564(b)(1) of the Act, 21 U.S.C. section 360bbb-3(b)(1), unless the authorization is terminated or revoked sooner.      Influenza A by PCR NEGATIVE NEGATIVE Final   Influenza B by PCR NEGATIVE NEGATIVE Final    Comment: (NOTE) The Xpert Xpress SARS-CoV-2/FLU/RSV assay is intended as an aid in  the diagnosis of influenza from Nasopharyngeal swab specimens and  should not be used as a sole basis for treatment. Nasal washings and  aspirates are unacceptable for Xpert Xpress SARS-CoV-2/FLU/RSV  testing.  Fact Sheet for Patients: https://www.moore.com/  Fact Sheet for Healthcare Providers: https://www.young.biz/  This test is not yet approved or cleared by the [place] FDA and  has been authorized for detection and/or diagnosis of SARS-CoV-2 by  FDA under an Emergency Use Authorization (EUA). This EUA will remain  in effect (meaning this test can be used) for the duration of the  Covid-19  declaration under Section 564(b)(1) of the Act, 21  U.S.C. section 360bbb-3(b)(1), unless the authorization is  terminated or revoked. Performed at [facility], [address] [place], [place] [address]   Blood culture (routine x 2)     Status: None   Collection Time: 05/10/2020  5:14 PM   Specimen: BLOOD  Result Value Ref Range Status   Specimen Description BLOOD LEFT ARM  Final   Special Requests   Final    BOTTLES DRAWN AEROBIC AND ANAEROBIC Blood Culture adequate volume   Culture   Final    NO GROWTH 5 DAYS Performed at [facility] [address] [place], [place] [address]    Report Status 05/20/2020 FINAL  Final  Blood culture (routine x 2)     Status: None   Collection Time: 05/06/2020  5:14 PM   Specimen: BLOOD  Result Value Ref Range Status   Specimen Description BLOOD RIGHT ARM  Final   Special Requests   Final    BOTTLES DRAWN AEROBIC AND ANAEROBIC Blood Culture adequate volume   Culture   Final    NO GROWTH 5 DAYS Performed at [facility], [address] [place], [place] [address]    Report Status 05/20/2020 FINAL  Final         Radiology Studies: No results found.      Scheduled Meds: . amLODipine  10 mg Oral Daily  . vitamin C  500 mg Oral Daily  . aspirin EC  81 mg Oral Daily  . carvedilol  12.5 mg Oral BID WC  . enoxaparin (LOVENOX) injection  1 mg/kg Subcutaneous Q24H  . famotidine  10 mg Oral Daily  . feeding supplement (NEPRO CARB STEADY)  237 mL Oral TID BM  . fluticasone  2 spray Each Nare Daily  . guaiFENesin  600 mg Oral BID  . hydrALAZINE  100 mg Oral TID  . influenza vac split quadrivalent PF  0.5 mL Intramuscular Tomorrow-1000  . insulin aspart  0-20 Units Subcutaneous TID WC  . insulin aspart  0-5 Units Subcutaneous QHS  . insulin aspart  12 Units Subcutaneous TID WC  . insulin glargine  55 Units Subcutaneous BID  . linagliptin  5 mg Oral Daily  . methylPREDNISolone (SOLU-MEDROL) injection  60 mg  Intravenous Q12H  . multivitamin with minerals  1 tablet Oral Daily  . pneumococcal 23 valent vaccine  0.5  mL Intramuscular Tomorrow-1000  . zinc sulfate  220 mg Oral Daily   Continuous Infusions:    LOS: 8 days    Time spent: 36 minutes spent on chart review, discussion with nursing staff, consultants, updating family and interview/physical exam; more than 50% of that time was spent in counseling and/or coordination of care.    Alvira Philips [place], DO [facility] Available via Epic secure chat 7am-7pm After these hours, please refer to coverage provider listed on [email or web address] 05/23/2020, 10:43 AM

## 2020-05-23 NOTE — Progress Notes (Signed)
Nutrition Follow-up  DOCUMENTATION CODES:   Non-severe (moderate) malnutrition in context of chronic illness  INTERVENTION:  Will discontinue Nepro.  Provide Glucerna Shake po TID, each supplement provides 220 kcal and 10 grams of protein. Patient prefers Soil scientist.  RD reviewed "Carbohydrate Counting for People with Diabetes" handout from the Academy of Nutrition [employer] Dietetics with patient over the phone. Will place handout in discharge instructions. Discussed different food groups and their effects on blood sugar, emphasizing carbohydrate-containing foods. Provided list of carbohydrates and recommended serving sizes of common foods. Discussed importance of controlled and consistent carbohydrate intake throughout the day. Provided examples of ways to balance meals/snacks and encouraged intake of high-fiber, whole grain complex carbohydrates. Teach back method used.  NUTRITION DIAGNOSIS:   Moderate Malnutrition related to chronic illness (COPD) as evidenced by moderate fat depletion, moderate muscle depletion, severe muscle depletion.  Ongoing.  GOAL:   Patient will meet greater than or equal to 90% of their needs  Met.  MONITOR:   PO intake, Supplement acceptance, Labs, Weight trends, I & O's  REASON FOR ASSESSMENT:   Malnutrition Screening Tool, Consult Diet education  ASSESSMENT:   59 year old male with PMHx of COPD, DM, HTN admitted with COVID-19, AKI with acidosis.  Spoke with patient over the phone. He reports his appetite is much better now and he is eating 100% of his meals. According to chart patient has had 1570 kcal and 86 grams of protein just from meals in the past 24 hours. He also drank 3 bottles of Nepro. Now that PO intake at meals has improved will change patient to Glucerna. Patient is amenable to this and reports he would like the strawberry flavor. Also noted per chart patient is ordering food from outside of hospital as per RN patient was found  drinking chocolate milk shake from [facility]. Provided DM diet education to patient over the phone. Will place handout in discharge summary. Patient actively participated in education and asked appropriate questions.  Medications reviewed and include: vitamin C 500 mg daily, famotidine, Novolog 0-20 units TID, Novolog 0-5 units QHS, Novolog 5 units TID, Lantus 55 units BID, Tradjenta 5 mg daily, Solu-Medrol 60 mg Q12hrs IV, MVI daily, zinc sulfate 220 mg daily.  Labs reviewed: CBG 84-433, Sodium 131, Chloride 95, BUN 104, Creatinine 4.12.  I/O: 1725 mL UOP yesterday (1 mL/kg/hr)  No weight to trend since 9/27.  Diet Order:   Diet Order            Diet Carb Modified Fluid consistency: Thin; Room service appropriate? Yes  Diet effective now                EDUCATION NEEDS:   Not appropriate for education at this time  Skin:  Skin Assessment: Reviewed RN Assessment  Last BM:  05/22/2020 - medium type 4  Height:   Ht Readings from Last 1 Encounters:  05/01/2020 $RemoveB'5\' 10"'OvxWlxfV$  (1.778 m)   Weight:   Wt Readings from Last 1 Encounters:  05/16/20 71.7 kg   Ideal Body Weight:  75.5 kg  BMI:  Body mass index is 22.68 kg/m.  Estimated Nutritional Needs:   Kcal:  2000-2200  Protein:  100-110 grams  Fluid:  >/= 2 L/day  Jacklynn Barnacle, MS, RD, LDN Pager number available on Amion

## 2020-05-24 DIAGNOSIS — U071 COVID-19: Secondary | ICD-10-CM | POA: Diagnosis not present

## 2020-05-24 DIAGNOSIS — J9601 Acute respiratory failure with hypoxia: Secondary | ICD-10-CM | POA: Diagnosis not present

## 2020-05-24 DIAGNOSIS — N179 Acute kidney failure, unspecified: Secondary | ICD-10-CM | POA: Diagnosis not present

## 2020-05-24 DIAGNOSIS — E1365 Other specified diabetes mellitus with hyperglycemia: Secondary | ICD-10-CM | POA: Diagnosis not present

## 2020-05-24 LAB — BASIC METABOLIC PANEL
Anion gap: 12 (ref 5–15)
BUN: 101 mg/dL — ABNORMAL HIGH (ref 6–20)
CO2: 23 mmol/L (ref 22–32)
Calcium: 8 mg/dL — ABNORMAL LOW (ref 8.9–10.3)
Chloride: 97 mmol/L — ABNORMAL LOW (ref 98–111)
Creatinine, Ser: 4.29 mg/dL — ABNORMAL HIGH (ref 0.61–1.24)
GFR calc Af Amer: 17 mL/min — ABNORMAL LOW (ref >60)
GFR calc non Af Amer: 14 mL/min — ABNORMAL LOW (ref >60)
Glucose, Bld: 135 mg/dL — ABNORMAL HIGH (ref 70–99)
Potassium: 3.5 mmol/L (ref 3.5–5.1)
Sodium: 132 mmol/L — ABNORMAL LOW (ref 135–145)

## 2020-05-24 LAB — GLUCOSE, CAPILLARY
Glucose-Capillary: 67 mg/dL — ABNORMAL LOW (ref 70–99)
Glucose-Capillary: 208 mg/dL — ABNORMAL HIGH (ref 70–99)
Glucose-Capillary: 230 mg/dL — ABNORMAL HIGH (ref 70–99)
Glucose-Capillary: 275 mg/dL — ABNORMAL HIGH (ref 70–99)

## 2020-05-24 LAB — C-REACTIVE PROTEIN: CRP: 3.2 mg/dL — ABNORMAL HIGH (ref <1.0)

## 2020-05-24 LAB — CBC
HCT: 26.6 % — ABNORMAL LOW (ref 39.0–52.0)
Hemoglobin: 9.7 g/dL — ABNORMAL LOW (ref 13.0–17.0)
MCH: 28.9 pg (ref 26.0–34.0)
MCHC: 36.5 g/dL — ABNORMAL HIGH (ref 30.0–36.0)
MCV: 79.2 fL — ABNORMAL LOW (ref 80.0–100.0)
Platelets: 147 10*3/uL — ABNORMAL LOW (ref 150–400)
RBC: 3.36 MIL/uL — ABNORMAL LOW (ref 4.22–5.81)
RDW: 12.9 % (ref 11.5–15.5)
WBC: 11.9 10*3/uL — ABNORMAL HIGH (ref 4.0–10.5)
nRBC: 0 % (ref 0.0–0.2)

## 2020-05-24 LAB — FIBRIN DERIVATIVES D-DIMER (ARMC ONLY): Fibrin derivatives D-dimer (ARMC): 1627.43 ng/mL (FEU) — ABNORMAL HIGH (ref 0.00–499.00)

## 2020-05-24 MED ORDER — POTASSIUM CHLORIDE CRYS ER 20 MEQ PO TBCR
40.0000 meq | EXTENDED_RELEASE_TABLET | Freq: Once | ORAL | Status: AC
  Administered 2020-05-24: 08:00:00 40 meq via ORAL
  Filled 2020-05-24: qty 2

## 2020-05-24 MED ORDER — INSULIN GLARGINE 100 UNIT/ML ~~LOC~~ SOLN
50.0000 [IU] | Freq: Two times a day (BID) | SUBCUTANEOUS | Status: DC
  Administered 2020-05-24: 50 [IU] via SUBCUTANEOUS
  Filled 2020-05-24 (×3): qty 0.5

## 2020-05-24 NOTE — Progress Notes (Signed)
[facility]  ROUNDING NOTE   Subjective:  Patient still on  9L supplemental O2 via high flow nasal cannula, maintaining SpO2 above 90%. He denies worsening SOB, reports able to consume his breakfast without nausea or vomiting. UOP for the preceding 24 hours is 1850 ml.     = 10/04 0701 - 10/05 0700 In: -  Out: 1850 [Urine:1850]  Lab Results  Component Value Date   CREATININE 4.29 (H) 05/24/2020   CREATININE 4.12 (H) 05/23/2020   CREATININE 4.21 (H) 05/22/2020     Objective:  Vital signs in last 24 hours:  Temp:  [97.7 F (36.5 C)-98.1 F (36.7 C)] 97.8 F (36.6 C) (10/05 0750) Pulse Rate:  [78-101] 78 (10/05 0750) Resp:  [16-20] 16 (10/05 0750) BP: (128-145)/(58-74) 133/74 (10/05 0750) SpO2:  [90 %-98 %] 93 % (10/05 0750)  Weight change:  Filed Weights   05-23-2020 1707 05/16/20 1539  Weight: 77.1 kg 71.7 kg    Intake/Output: I/O last 3 completed shifts: In: -  Out: 2925 [Urine:2925]   Intake/Output this shift:  Total I/O In: -  Out: 300 [Urine:300]  Physical Exam: General:  In no acute distress,resting in bed  Head: Normocephalic,Atraumatic,High flow nasal canula in place  Eyes: Anicteric  Lungs:   Respiration even,unlabored, lungs clear  Heart:  Regular,HR in 90's  Abdomen:  Soft, non distended  Extremities: No  peripheral edema.  Neurologic: Awake,alert,speech clear and appropriate  Skin: No acute rashes or  lesions       Basic Metabolic Panel: Recent Labs  Lab 05/19/20 0320 05/19/20 0320 05/20/20 0359 05/20/20 0359 05/21/20 0540 05/21/20 0540 05/22/20 0401 05/23/20 0432 05/24/20 0411  NA 137   < > 137  --  132*  --  132* 131* 132*  K 3.0*   < > 3.6  --  3.8  --  3.7 3.6 3.5  CL 101   < > 103  --  98  --  97* 95* 97*  CO2 22   < > 23  --  22  --  25 26 23   GLUCOSE 126*   < > 209*  --  311*  --  226* 113* 135*  BUN 103*   < > 99*  --  97*  --  97* 104* 101*  CREATININE 4.41*   < > 4.11*  --  4.15*  --  4.21* 4.12* 4.29*   CALCIUM 8.1*   < > 8.0*   < > 8.3*   < > 8.0* 8.0* 8.0*  MG 2.0  --  2.2  --  2.3  --  2.2  --   --    < > = values in this interval not displayed.    Liver Function Tests: Recent Labs  Lab 05/18/20 0538 05/19/20 0320 05/20/20 0359  AST 21 25 37  ALT 16 15 22   ALKPHOS 72 74 69  BILITOT 0.8 0.7 0.7  PROT 5.2* 5.3* 5.1*  ALBUMIN 2.1* 2.2* 2.2*   No results for input(s): LIPASE, AMYLASE in the last 168 hours. No results for input(s): AMMONIA in the last 168 hours.  CBC: Recent Labs  Lab 05/18/20 0538 05/18/20 0538 05/19/20 0320 05/19/20 0320 05/20/20 0359 05/21/20 0540 05/22/20 0401 05/23/20 0432 05/24/20 0411  WBC 5.5   < > 8.0   < > 8.0 6.8 9.9 24.8* 11.9*  NEUTROABS 5.0  --  7.3  --  6.8  --   --   --   --   HGB 10.7*   < >  10.9*   < > 10.8* 10.5* 9.7* 9.7* 9.7*  HCT 28.8*   < > 28.6*   < > 29.1* 28.4* 28.1* 27.7* 26.6*  MCV 78.3*   < > 75.9*   < > 77.6* 77.8* 81.7 81.2 79.2*  PLT 210   < > 194   < > 191 225 218 208 147*   < > = values in this interval not displayed.    Cardiac Enzymes: No results for input(s): CKTOTAL, CKMB, CKMBINDEX, TROPONINI in the last 168 hours.  BNP: Invalid input(s): POCBNP  CBG: Recent Labs  Lab 05/23/20 0742 05/23/20 1158 05/23/20 1546 05/23/20 2116 05/24/20 0749  GLUCAP 84 148* 433* 304* 67*    Microbiology: Results for orders placed or performed during the hospital encounter of 04/28/2020  Respiratory Panel by RT PCR (Flu A&B, Covid) - Nasopharyngeal Swab     Status: Abnormal   Collection Time: 04/20/2020  5:14 PM   Specimen: Nasopharyngeal Swab  Result Value Ref Range Status   SARS Coronavirus 2 by RT PCR POSITIVE (A) NEGATIVE Final    Comment: RESULT CALLED TO, READ BACK BY AND VERIFIED WITH: LORRIE LEMONS ON 04/21/2020 AT 1851 QSD (NOTE) SARS-CoV-2 target nucleic acids are DETECTED.  SARS-CoV-2 RNA is generally detectable in upper respiratory specimens  during the acute phase of infection. Positive results are  indicative of the presence of the identified virus, but do not rule out bacterial infection or co-infection with other pathogens not detected by the test. Clinical correlation with patient history and other diagnostic information is necessary to determine patient infection status. The expected result is Negative.  Fact Sheet for Patients:  https://www.moore.com/  Fact Sheet for Healthcare Providers: https://www.young.biz/  This test is not yet approved or cleared by the [place] FDA and  has been authorized for detection and/or diagnosis of SARS-CoV-2 by FDA under an Emergency Use Authorization (EUA).  This EUA will remain in effect (meaning this test can b e used) for the duration of  the COVID-19 declaration under Section 564(b)(1) of the Act, 21 U.S.C. section 360bbb-3(b)(1), unless the authorization is terminated or revoked sooner.      Influenza A by PCR NEGATIVE NEGATIVE Final   Influenza B by PCR NEGATIVE NEGATIVE Final    Comment: (NOTE) The Xpert Xpress SARS-CoV-2/FLU/RSV assay is intended as an aid in  the diagnosis of influenza from Nasopharyngeal swab specimens and  should not be used as a sole basis for treatment. Nasal washings and  aspirates are unacceptable for Xpert Xpress SARS-CoV-2/FLU/RSV  testing.  Fact Sheet for Patients: https://www.moore.com/  Fact Sheet for Healthcare Providers: https://www.young.biz/  This test is not yet approved or cleared by the [place] FDA and  has been authorized for detection and/or diagnosis of SARS-CoV-2 by  FDA under an Emergency Use Authorization (EUA). This EUA will remain  in effect (meaning this test can be used) for the duration of the  Covid-19 declaration under Section 564(b)(1) of the Act, 21  U.S.C. section 360bbb-3(b)(1), unless the authorization is  terminated or revoked. Performed at [facility], [address] [place], [place] [address]   Blood culture (routine x 2)     Status: None   Collection Time: 05/06/2020  5:14 PM   Specimen: BLOOD  Result Value Ref Range Status   Specimen Description BLOOD LEFT ARM  Final   Special Requests   Final    BOTTLES DRAWN AEROBIC AND ANAEROBIC Blood Culture adequate volume   Culture   Final  NO GROWTH 5 DAYS Performed at [facility], [address] [place] [place], [place] [address]    Report Status 05/20/2020 FINAL  Final  Blood culture (routine x 2)     Status: None   Collection Time: 04/22/2020  5:14 PM   Specimen: BLOOD  Result Value Ref Range Status   Specimen Description BLOOD RIGHT ARM  Final   Special Requests   Final    BOTTLES DRAWN AEROBIC AND ANAEROBIC Blood Culture adequate volume   Culture   Final    NO GROWTH 5 DAYS Performed at [facility], [address], [place], [place] [address]    Report Status 05/20/2020 FINAL  Final    Coagulation Studies: No results for input(s): LABPROT, INR in the last 72 hours.  Urinalysis: No results for input(s): COLORURINE, LABSPEC, PHURINE, GLUCOSEU, HGBUR, BILIRUBINUR, KETONESUR, PROTEINUR, UROBILINOGEN, NITRITE, LEUKOCYTESUR in the last 72 hours.  Invalid input(s): APPERANCEUR    Imaging: No results found.   Medications:    . amLODipine  10 mg Oral Daily  . vitamin C  500 mg Oral Daily  . aspirin EC  81 mg Oral Daily  . carvedilol  12.5 mg Oral BID WC  . enoxaparin (LOVENOX) injection  1 mg/kg Subcutaneous Q24H  . famotidine  10 mg Oral Daily  . feeding supplement (GLUCERNA SHAKE)  237 mL Oral TID BM  . fluticasone  2 spray Each Nare Daily  . guaiFENesin  600 mg Oral BID  . hydrALAZINE  100 mg Oral TID  . influenza vac split quadrivalent PF  0.5 mL Intramuscular Tomorrow-1000  . insulin aspart  0-20 Units Subcutaneous TID WC  . insulin aspart  0-5 Units Subcutaneous QHS  . insulin aspart  5 Units Subcutaneous TID WC  . insulin glargine  55 Units Subcutaneous BID  .  linagliptin  5 mg Oral Daily  . methylPREDNISolone (SOLU-MEDROL) injection  60 mg Intravenous Q12H  . multivitamin with minerals  1 tablet Oral Daily  . pneumococcal 23 valent vaccine  0.5 mL Intramuscular Tomorrow-1000  . zinc sulfate  220 mg Oral Daily   acetaminophen, chlorpheniramine-HYDROcodone, guaiFENesin-dextromethorphan, magnesium hydroxide, ondansetron **OR** ondansetron (ZOFRAN) IV, sodium chloride, traZODone  Assessment/ Plan:  Mr. Jackson Stark is a 59 y.o.  male presented from home for shortness of breath and increasing weakness.  Hypoxic on room air as per EMS.  Diagnosed with Covid pneumonia.  He has recurrent falls, nasal congestion.  Lab results showed acidosis, hyponatremia, hypokalemia, increased creatinine, low albumin.h/o diabetes for 20 years, was not compliant with medications for the past one year.  #Acute kidney injury Lab Results  Component Value Date   CREATININE 4.29 (H) 05/24/2020   CREATININE 4.12 (H) 05/23/2020   CREATININE 4.21 (H) 05/22/2020   Urine output 1850 ml. Creatinine 4.29 today, trending up.  #Diabetes type 2, poorly controlled Last HbA1C: 13.8 on 05/16/20 Blood sugar readings within acceptable range, we will continue current antihyperglycemic regimen  #Multifocal pneumonia secondary to COVID-19 Still on 9L O2 via HFNC.  SOB getting better,continues to be on steroids   #Hypokalemia Potassium today is 3.5  Received one dose of Potassium supplement today     LOS: 9 Jackson Stark 10/5/202111:15 AM

## 2020-05-24 NOTE — Progress Notes (Signed)
Occupational Therapy Treatment Patient Details Name: Jackson Stark MRN: [id number] DOB: [birth year] Today's Date: 05/24/2020    History of present illness Patient is a 59 y.o. male who presented from home for shortness of breath, dry cough, and and increasing weakness, recurrent falls. Work up revealed Acute hypoxic respiratory failure secondary to acute Covid-19 viral pneumonia, elevated D-dimer, Venous duplex vascular ultrasound bilateral lower extremities negative for DVT, acute renal familure, hypokalemia.  Medical history of  COPD, essential hypertension, and type 2 diabetes mellitus.    OT comments  Pt seen for OT tx this date. Pt alert and agreeable. Endorses feeling slightly SOB at rest. Currently on 15L O2. O2 sats 89%-93%, HR in 80's. Pt instructed in energy conservation strategies including work simplification, pursed lip breathing, falls prevention, activity pacing, and home/routines modifications to support breath recovery and safety during ADL tasks. Pt verbalized understanding. Continues to benefit from skilled OT services to maximize return to PLOF and minimize risk of over exertion and readmission and need for increased caregiver burden.    Follow Up Recommendations  Home health OT;Supervision - Intermittent    Equipment Recommendations  3 in 1 bedside commode;Other (comment) (reacher, LH sponge)    Recommendations for Other Services      Precautions / Restrictions Precautions Precautions: Fall Precaution Comments: watch O2 Restrictions Weight Bearing Restrictions: No       Mobility Bed Mobility Overal bed mobility: Modified Independent             General bed mobility comments: Increased time + HOB slighty elevated to sup<>sit  Transfers Overall transfer level: Needs assistance Equipment used: 1 person hand held assist;None Transfers: Sit to/from Stand Sit to Stand: Min guard         General transfer comment: CGA for safety. pt quickly desaturates  to 78%    Balance Overall balance assessment: Needs assistance;History of Falls Sitting-balance support: Feet supported;No upper extremity supported Sitting balance-Leahy Scale: Good Sitting balance - Comments: no LOB in sitting   Standing balance support: Single extremity supported;During functional activity Standing balance-Leahy Scale: Fair Standing balance comment: +1 UE HHA for safety. limited distance activity in standoing 2/2 to respiratory status concerns                           ADL either performed or assessed with clinical judgement   ADL                                               Vision       Perception     Praxis      Cognition Arousal/Alertness: Awake/alert Behavior During Therapy: WFL for tasks assessed/performed Overall Cognitive Status: Within Functional Limits for tasks assessed                                 General Comments: Pt is A and O x 4. on 6L HFNC upon entering room but needs to be increased throughout session to maintain > 88%        Exercises Other Exercises Other Exercises: Pt instructed in energy conservation strategies including work simplification, pursed lip breathing, falls prevention, activity pacing, and home/routines modifications to support breath recovery and safety during ADL tasks   Shoulder Instructions  General Comments      Pertinent Vitals/ Pain       Pain Assessment: No/denies pain Pain Intervention(s): Limited activity within patient's tolerance;Monitored during session;Repositioned  Home Living                                          Prior Functioning/Environment              Frequency  Min 1X/week        Progress Toward Goals  OT Goals(current goals can now be found in the care plan section)  Progress towards OT goals: Progressing toward goals  Acute Rehab OT Goals Patient Stated Goal: to go home  OT Goal Formulation: With  patient Time For Goal Achievement: 06/05/20 Potential to Achieve Goals: Good  Plan Discharge plan remains appropriate;Frequency remains appropriate    Co-evaluation                 AM-PAC OT "6 Clicks" Daily Activity     Outcome Measure   Help from another person eating meals?: None Help from another person taking care of personal grooming?: A Little Help from another person toileting, which includes using toliet, bedpan, or urinal?: A Little Help from another person bathing (including washing, rinsing, drying)?: A Little Help from another person to put on and taking off regular upper body clothing?: None Help from another person to put on and taking off regular lower body clothing?: A Little 6 Click Score: 20    End of Session Equipment Utilized During Treatment: Oxygen  OT Visit Diagnosis: Other abnormalities of gait and mobility (R26.89);History of falling (Z91.81)   Activity Tolerance Patient tolerated treatment well   Patient Left in chair;with call bell/phone within reach   Nurse Communication          Time: 1415-1430 OT Time Calculation (min): 15 min  Charges: OT General Charges $OT Visit: 1 Visit OT Treatments $Self Care/Home Management : 8-22 mins  Richrd Prime, MPH, MS, OTR/L ascom [phone] 05/24/20, 2:34 PM

## 2020-05-24 NOTE — Progress Notes (Signed)
Inpatient Diabetes Program Recommendations  AACE/ADA: New Consensus Statement on Inpatient Glycemic Control   Target Ranges:  Prepandial:   less than 140 mg/dL      Peak postprandial:   less than 180 mg/dL (1-2 hours)      Critically ill patients:  140 - 180 mg/dL   Results for Jackson Stark, Jackson Stark (MRN [id number]) as of 05/24/2020 08:11  Ref. Range 05/23/2020 07:42 05/23/2020 11:58 05/23/2020 15:46 05/23/2020 21:16 05/24/2020 07:49  Glucose-Capillary Latest Ref Range: 70 - 99 mg/dL 84 419 (H) 379 (H) 024 (H) 67 (L)   Review of Glycemic Control  Diabetes history: No Outpatient Diabetes medications: NA Current orders for Inpatient glycemic control: Lantus 55 units BID, Novolog 5 units TID with meals, Novolog 0-20 units TID with meals, Novolog 0-5 units QHS; Solumedrol 60 mg Q12H  Inpatient Diabetes Program Recommendations:    Insulin: If steroids are continued as ordered, please consider decreasing Lantus to 50 units BID and increasing meal coverage to Novolog 10 units TID with meals if patient eats at least 50% of meals.  Thanks, Orlando Penner, RN, MSN, CDE Diabetes Coordinator Inpatient Diabetes Program [phone] (Team Pager from 8am to 5pm)

## 2020-05-24 NOTE — Progress Notes (Addendum)
PROGRESS NOTE    Jackson Stark  VHQ:[id number] DOB: 06-06-1961 DOA: 04/25/2020 PCP: Patient, No Pcp Per    Brief Narrative:  Jackson Stark is a 59 year old male with past medical history notable for COPD, essential hypertension, type 2 diabetes mellitus who presented to the emergency department with progressive shortness of breath associated with dry cough and generalized weakness.  Also with recurrent falls.  In the ED, patient was noted to be hypoxic with SPO2 80% on room air with improvement to 93% on 10 L NRB.  Creatinine 5.78, anion gap 18, BNP 80.5.  Chest x-ray notable for left basal infiltrate.  Patient was started on IV Decadron and doxycycline and given 2 L bolus of normal saline.  TRH consulted for admission.   Assessment & Plan:   Active Problems:   Acute hypoxemic respiratory failure due to COVID-19 [facility])   Malnutrition of moderate degree   AKI (acute kidney injury) (HCC)   Generalized weakness   DM (diabetes mellitus), secondary uncontrolled (HCC)   Acute respiratory failure with hypoxia (HCC)   Acute hypoxic respiratory failure secondary to acute Covid-19 viral pneumonia during the ongoing 2020 Covid 19 Pandemic - POA Patient presenting to ED with progressive shortness of breath.  Was found to be hypoxic with SPO2 80% on room air on arrival.  Chest x-ray consistent with Covid-19 viral pneumonia; with repeat x-ray on 05/20/2020 showing worsening progression of multifocal pneumonia. --COVID test: + 05/07/2020 --CRP 8.7>6.2>4.5>3.0>2.6>7.4>3.8>1.7> labs pending this morning --ddimer 1,079>905>925>1475>2118>2049>1592>1742>1627 --Completed 5-day course of remdesivir on 05/19/2020 --Unable to start baricitinib 2/2 renal insufficiency/acute renal failure --Continue Solu-Medrol 60 mg IV q12h --prone for 2-3hrs every 12hrs if able --Continue supplemental oxygen, titrate to maintain SPO2 greater than 85%, on 8L HFNC --Continue supportive care with albuterol MDI prn, vitamin C,  zinc, Tylenol, antitussives (benzonatate/ Mucinex/Tussionex) --Follow CBC, CMP, D-dimer, ferritin, and CRP daily --Continue airborne/contact isolation precautions for 3 weeks from the day of diagnosis  The treatment plan and use of medications and known side effects were discussed with patient/family. Some of the medications used are based on case reports/anecdotal data.  All other medications being used in the management of COVID-19 based on limited study data.  Complete risks and long-term side effects are unknown, however in the best clinical judgment they seem to be of some benefit.  Patient wanted to proceed with treatment options provided.  Elevated D-dimer D-dimer now trending up despite treatment for Covid-19, 905>925>1475>2118.  Concern of blood clot whether DVT versus PE given known hypercoagulable state with Covid-19 infection coupled with poor mobility.  Unable to obtain CT angiogram chest due to renal failure.  Unable to obtain nuclear medicine VQ scan due to patient's inability to lay flat for 20 minutes with resultant worsening of his hypoxia.  Venous duplex vascular ultrasound bilateral lower extremities negative for DVT. --Started treatment dose Lovenox 10/1 --Continue to follow D-dimer daily  Acute renal failure Unknown baseline, poor outpatient follow-up/medical noncompliance.  Presented with creatinine 5.59.  Renal ultrasound with no acute abnormality. --Nephrology following, appreciate assistance --Cr 5.59>4.41>4.11>4.15>4.21>4.12>4.29 --Discontinued IV fluids on 10/1 due to worsening hypoxemia --Avoid nephrotoxins, renally dose all medications --Close monitoring of urinary output --Follow BMP daily  Hypokalemia Potassium 3.5 today, will replete --Repeat electrolytes in a.m. to include magnesium  Type 2 diabetes mellitus Hemoglobin A1c 13.8, poorly controlled. --Tradjenta 5 mg p.o. daily --Lantus 50 units [place] BID --NovoLog 5 units TID AC --Resistant insulin sliding  scale for coverage --CBGs qAC/HS  Essential hypertension, poorly controlled  BP 128/61 this morning. (range 128/58 - 149/70 past 24h) --amlodipine 10 mg p.o. daily --Hydralazine to 100 mg p.o. TID --Coreg to 12.5mg  BID --Continue monitor BP closely and adjust as needed  Tobacco use disorder Counseled.   DVT prophylaxis: Lovenox Code Status: Full code Family Communication: Attempted to update patient's fianc Felicia via telephone this morning, unsuccessful with no answer  Disposition Plan:  Status is: Inpatient  Remains inpatient appropriate because:Persistent severe electrolyte disturbances, Ongoing diagnostic testing needed not appropriate for outpatient work up, Unsafe d/c plan, IV treatments appropriate due to intensity of illness or inability to take PO and Inpatient level of care appropriate due to severity of illness   Dispo: The patient is from: Home              Anticipated d/c is to: Home              Anticipated d/c date is: > 3 days              Patient currently is not medically stable to d/c.  Continues with high FiO2 requirements   Consultants:   Nephrology  Procedures:   Renal ultrasound  Vascular duplex venous ultrasound bilateral lower extremities  Antimicrobials:   None   Subjective: Patient seen and examined bedside, resting comfortably.  Continues on 9L high flow nasal cannula with SPO2 93%, turned down to 8L.  Continues with slow progression to wean from high flow nasal oxygen.  Elevated blood sugar yesterday after somehow patient was able to receive a milkshake in his room.  No other questions or concerns at this time. Denies headache, no visual changes, no chest pain, no dizziness, no palpitations, no fever/chills/night sweats, no nausea/vomiting/diarrhea, no fatigue, no abdominal pain, no weakness.  No acute concerns per nursing staff at this time.   Objective: Vitals:   05/23/20 2047 05/23/20 2355 05/24/20 0427 05/24/20 0750  BP:  (!)  128/58 128/61 133/74  Pulse:  90 83 78  Resp:  16 20 16   Temp:  98 F (36.7 C) 98.1 F (36.7 C) 97.8 F (36.6 C)  TempSrc:    Oral  SpO2: 90% 92% 90% 93%  Weight:      Height:        Intake/Output Summary (Last 24 hours) at 05/24/2020 1145 Last data filed at 05/24/2020 0750 Gross per 24 hour  Intake --  Output 1700 ml  Net -1700 ml   Filed Weights   04/22/2020 1707 05/16/20 1539  Weight: 77.1 kg 71.7 kg    Examination:  General exam: Appears calm and comfortable, chronically ill in appearance Respiratory system: Decreased breath sounds bilateral bases, no wheezing/rhonchi, normal respiratory effort without accessory muscle use, on 8L high flow nasal cannula Cardiovascular system: S1 & S2 heard, RRR. No JVD, murmurs, rubs, gallops or clicks. No pedal edema. Gastrointestinal system: Abdomen is nondistended, soft and nontender. No organomegaly or masses felt. Normal bowel sounds heard. Central nervous system: Alert and oriented. No focal neurological deficits. Extremities: Symmetric 5 x 5 power. Skin: No rashes, lesions or ulcers Psychiatry: Judgement and insight appear poor. Mood & affect appropriate.     Data Reviewed: I have personally reviewed following labs and imaging studies  CBC: Recent Labs  Lab 05/18/20 0538 05/18/20 0538 05/19/20 0320 05/19/20 0320 05/20/20 0359 05/21/20 0540 05/22/20 0401 05/23/20 0432 05/24/20 0411  WBC 5.5   < > 8.0   < > 8.0 6.8 9.9 24.8* 11.9*  NEUTROABS 5.0  --  7.3  --  6.8  --   --   --   --   HGB 10.7*   < > 10.9*   < > 10.8* 10.5* 9.7* 9.7* 9.7*  HCT 28.8*   < > 28.6*   < > 29.1* 28.4* 28.1* 27.7* 26.6*  MCV 78.3*   < > 75.9*   < > 77.6* 77.8* 81.7 81.2 79.2*  PLT 210   < > 194   < > 191 225 218 208 147*   < > = values in this interval not displayed.   Basic Metabolic Panel: Recent Labs  Lab 05/19/20 0320 05/19/20 0320 05/20/20 0359 05/21/20 0540 05/22/20 0401 05/23/20 0432 05/24/20 0411  NA 137   < > 137 132* 132*  131* 132*  K 3.0*   < > 3.6 3.8 3.7 3.6 3.5  CL 101   < > 103 98 97* 95* 97*  CO2 22   < > GLUCOSE 126*   < > 209* 311* 226* 113* 135*  BUN 103*   < > 99* 97* 97* 104* 101*  CREATININE 4.41*   < > 4.11* 4.15* 4.21* 4.12* 4.29*  CALCIUM 8.1*   < > 8.0* 8.3* 8.0* 8.0* 8.0*  MG 2.0  --  2.2 2.3 2.2  --   --    < > = values in this interval not displayed.   GFR: Estimated Creatinine Clearance: 19.3 mL/min (A) (by C-G formula based on SCr of 4.29 mg/dL (H)). Liver Function Tests: Recent Labs  Lab 05/18/20 0538 05/19/20 0320 05/20/20 0359  AST 21 25 37  ALT ALKPHOS 72 74 69  BILITOT 0.8 0.7 0.7  PROT 5.2* 5.3* 5.1*  ALBUMIN 2.1* 2.2* 2.2*   No results for input(s): LIPASE, AMYLASE in the last 168 hours. No results for input(s): AMMONIA in the last 168 hours. Coagulation Profile: No results for input(s): INR, PROTIME in the last 168 hours. Cardiac Enzymes: No results for input(s): CKTOTAL, CKMB, CKMBINDEX, TROPONINI in the last 168 hours. BNP (last 3 results) No results for input(s): PROBNP in the last 8760 hours. HbA1C: No results for input(s): HGBA1C in the last 72 hours. CBG: Recent Labs  Lab 05/23/20 1158 05/23/20 1546 05/23/20 2116 05/24/20 0749 05/24/20 1132  GLUCAP 148* 433* 304* 67* 208*   Lipid Profile: No results for input(s): CHOL, HDL, LDLCALC, TRIG, CHOLHDL, LDLDIRECT in the last 72 hours. Thyroid Function Tests: No results for input(s): TSH, T4TOTAL, FREET4, T3FREE, THYROIDAB in the last 72 hours. Anemia Panel: No results for input(s): VITAMINB12, FOLATE, FERRITIN, TIBC, IRON, RETICCTPCT in the last 72 hours. Sepsis Labs: No results for input(s): PROCALCITON, LATICACIDVEN in the last 168 hours.  Recent Results (from the past 240 hour(s))  Respiratory Panel by RT PCR (Flu A&B, Covid) - Nasopharyngeal Swab     Status: Abnormal   Collection Time: 04/29/2020  5:14 PM   Specimen: Nasopharyngeal Swab  Result Value Ref Range Status    SARS Coronavirus 2 by RT PCR POSITIVE (A) NEGATIVE Final    Comment: RESULT CALLED TO, READ BACK BY AND VERIFIED WITH: LORRIE LEMONS ON 05/08/2020 AT 1851 QSD (NOTE) SARS-CoV-2 target nucleic acids are DETECTED.  SARS-CoV-2 RNA is generally detectable in upper respiratory specimens  during the acute phase of infection. Positive results are indicative of the presence of the identified virus, but do not rule out bacterial infection or co-infection with other pathogens not detected by the test. Clinical correlation with patient history and  other diagnostic information is necessary to determine patient infection status. The expected result is Negative.  Fact Sheet for Patients:  https://www.moore.com/  Fact Sheet for Healthcare Providers: https://www.young.biz/  This test is not yet approved or cleared by the [place] FDA and  has been authorized for detection and/or diagnosis of SARS-CoV-2 by FDA under an Emergency Use Authorization (EUA).  This EUA will remain in effect (meaning this test can b e used) for the duration of  the COVID-19 declaration under Section 564(b)(1) of the Act, 21 U.S.C. section 360bbb-3(b)(1), unless the authorization is terminated or revoked sooner.      Influenza A by PCR NEGATIVE NEGATIVE Final   Influenza B by PCR NEGATIVE NEGATIVE Final    Comment: (NOTE) The Xpert Xpress SARS-CoV-2/FLU/RSV assay is intended as an aid in  the diagnosis of influenza from Nasopharyngeal swab specimens and  should not be used as a sole basis for treatment. Nasal washings and  aspirates are unacceptable for Xpert Xpress SARS-CoV-2/FLU/RSV  testing.  Fact Sheet for Patients: https://www.moore.com/  Fact Sheet for Healthcare Providers: https://www.young.biz/  This test is not yet approved or cleared by the [place] FDA and  has been authorized for detection and/or diagnosis of  SARS-CoV-2 by  FDA under an Emergency Use Authorization (EUA). This EUA will remain  in effect (meaning this test can be used) for the duration of the  Covid-19 declaration under Section 564(b)(1) of the Act, 21  U.S.C. section 360bbb-3(b)(1), unless the authorization is  terminated or revoked. Performed at [facility], [address] [place], [place] [address]   Blood culture (routine x 2)     Status: None   Collection Time: 05/13/2020  5:14 PM   Specimen: BLOOD  Result Value Ref Range Status   Specimen Description BLOOD LEFT ARM  Final   Special Requests   Final    BOTTLES DRAWN AEROBIC AND ANAEROBIC Blood Culture adequate volume   Culture   Final    NO GROWTH 5 DAYS Performed at [facility], [address], [place], [place] [address]    Report Status 05/20/2020 FINAL  Final  Blood culture (routine x 2)     Status: None   Collection Time: 05/16/2020  5:14 PM   Specimen: BLOOD  Result Value Ref Range Status   Specimen Description BLOOD RIGHT ARM  Final   Special Requests   Final    BOTTLES DRAWN AEROBIC AND ANAEROBIC Blood Culture adequate volume   Culture   Final    NO GROWTH 5 DAYS Performed at [facility], [address], [place], [place] [address]    Report Status 05/20/2020 FINAL  Final         Radiology Studies: No results found.      Scheduled Meds: . amLODipine  10 mg Oral Daily  . vitamin C  500 mg Oral Daily  . aspirin EC  81 mg Oral Daily  . carvedilol  12.5 mg Oral BID WC  . enoxaparin (LOVENOX) injection  1 mg/kg Subcutaneous Q24H  . famotidine  10 mg Oral Daily  . feeding supplement (GLUCERNA SHAKE)  237 mL Oral TID BM  . fluticasone  2 spray Each Nare Daily  . guaiFENesin  600 mg Oral BID  . hydrALAZINE  100 mg Oral TID  . influenza vac split quadrivalent PF  0.5 mL Intramuscular Tomorrow-1000  . insulin aspart  0-20 Units Subcutaneous TID WC  . insulin aspart  0-5 Units Subcutaneous QHS  .  insulin aspart  5  Units Subcutaneous TID WC  . insulin glargine  55 Units Subcutaneous BID  . linagliptin  5 mg Oral Daily  . methylPREDNISolone (SOLU-MEDROL) injection  60 mg Intravenous Q12H  . multivitamin with minerals  1 tablet Oral Daily  . pneumococcal 23 valent vaccine  0.5 mL Intramuscular Tomorrow-1000  . zinc sulfate  220 mg Oral Daily   Continuous Infusions:    LOS: 9 days    Time spent: 36 minutes spent on chart review, discussion with nursing staff, consultants, updating family and interview/physical exam; more than 50% of that time was spent in counseling and/or coordination of care.    Alvira PhilipsEric J [place], DO [facility] Available via Epic secure chat 7am-7pm After these hours, please refer to coverage provider listed on [email or web address] 05/24/2020, 11:45 AM

## 2020-05-24 NOTE — Progress Notes (Signed)
Physical Therapy Treatment Patient Details Name: Jackson Stark MRN: [id number] DOB: Aug 16, 1961 Today's Date: 05/24/2020    History of Present Illness Patient is a 59 y.o. male who presented from home for shortness of breath, dry cough, and and increasing weakness, recurrent falls. Work up revealed Acute hypoxic respiratory failure secondary to acute Covid-19 viral pneumonia, elevated D-dimer, Venous duplex vascular ultrasound bilateral lower extremities negative for DVT, acute renal familure, hypokalemia.  Medical history of  COPD, essential hypertension, and type 2 diabetes mellitus.     PT Comments    Pt was side lying in bed upon arriving on 6 L HFNC. He agrees to PT session and is eager to attempt OOB activity. Pt demonstrates good strength and safety with getting OOB however is limited by desaturation to 70s with very minimal activity. Had to be increased to 10L HFNC with progression to EOB prior to being increased to 15 L HFNC in standing and only ambulating 55ft. Took ~ 10 minutes for sao2 to recover to greater than 88% on 15 L. RN notified post session. PT recommends Home with HH if respiratory status improves. Pt will benefit from HHPT at DC to address safety deficits and improve independence.At conclusion of session, pt was sitting in recliner with chair alarm in place, call bell in reach, and RN aware of increased O2 requirements.    Follow Up Recommendations  Home health PT     Equipment Recommendations  None recommended by PT    Recommendations for Other Services       Precautions / Restrictions Precautions Precautions: Fall Precaution Comments: watch O2 Restrictions Weight Bearing Restrictions: No    Mobility  Bed Mobility Overal bed mobility: Modified Independent             General bed mobility comments: Increased time + HOB slighty elevated to sup<>sit  Transfers Overall transfer level: Needs assistance Equipment used: 1 person hand held  assist;None Transfers: Sit to/from Stand Sit to Stand: Min guard         General transfer comment: CGA for safety. pt quickly desaturates to 78%  Ambulation/Gait Ambulation/Gait assistance: Min guard Gait Distance (Feet): 5 Feet Assistive device: 1 person hand held assist;None Gait Pattern/deviations: WFL(Within Functional Limits) Gait velocity: decreased   General Gait Details: Pt was able to ambulate form EOb to recliner ~ 5 ft without LOB or unsteadiness however quickly desaturates and requires HFNC to be increased to 15 L x 10 minutes to recover to >88%. RN notified that pt requires increase O2 and was left on 15 L HFNC up in chair       Balance Overall balance assessment: Needs assistance;History of Falls Sitting-balance support: Feet supported;No upper extremity supported Sitting balance-Leahy Scale: Good Sitting balance - Comments: no LOB in sitting   Standing balance support: Single extremity supported;During functional activity Standing balance-Leahy Scale: Fair Standing balance comment: +1 UE HHA for safety. limited distance activity in standoing 2/2 to respiratory status concerns          Cognition Arousal/Alertness: Awake/alert Behavior During Therapy: WFL for tasks assessed/performed Overall Cognitive Status: Within Functional Limits for tasks assessed      General Comments: Pt is A and O x 4. on 6L HFNC upon entering room but needs to be increased throughout session to maintain > 88%             Pertinent Vitals/Pain Pain Assessment: No/denies pain Pain Intervention(s): Limited activity within patient's tolerance;Monitored during session;Repositioned  PT Goals (current goals can now be found in the care plan section) Acute Rehab PT Goals Patient Stated Goal: to go home  Progress towards PT goals: Progressing toward goals    Frequency    Min 2X/week      PT Plan Current plan remains appropriate       AM-PAC PT "6 Clicks"  Mobility   Outcome Measure  Help needed turning from your back to your side while in a flat bed without using bedrails?: None Help needed moving from lying on your back to sitting on the side of a flat bed without using bedrails?: None Help needed moving to and from a bed to a chair (including a wheelchair)?: A Little Help needed standing up from a chair using your arms (e.g., wheelchair or bedside chair)?: A Little Help needed to walk in hospital room?: A Little Help needed climbing 3-5 steps with a railing? : A Little 6 Click Score: 20    End of Session Equipment Utilized During Treatment: Oxygen (6L HFNC to 15 L HFNC) Activity Tolerance: Patient limited by fatigue Patient left: in chair;with call bell/phone within reach;with chair alarm set Nurse Communication: Mobility status PT Visit Diagnosis: Repeated falls (R29.6);Muscle weakness (generalized) (M62.81);Difficulty in walking, not elsewhere classified (R26.2)     Time: 3016-0109 PT Time Calculation (min) (ACUTE ONLY): 25 min  Charges:  $Therapeutic Activity: 23-37 mins                     Jetta Lout PTA 05/24/20, 1:32 PM

## 2020-05-24 NOTE — Consult Note (Signed)
ANTICOAGULATION CONSULT NOTE  Pharmacy Consult for Enoxaparin treatment dosing Indication: Concern for PE/DVT w/ rising ddimer and unable to perform CT/NM VQ scan  Not on File  Patient Measurements: Height: 5\' 10"  (177.8 cm) Weight: 71.7 kg (158 lb 1.1 oz) (bed scale) IBW/kg (Calculated) : 73  Vital Signs: Temp: 97.8 F (36.6 C) (10/05 0750) Temp Source: Oral (10/05 0750) BP: 133/74 (10/05 0750) Pulse Rate: 78 (10/05 0750)  Labs: Recent Labs    05/22/20 0401 05/22/20 0401 05/23/20 0432 05/24/20 0411  HGB 9.7*   < > 9.7* 9.7*  HCT 28.1*  --  27.7* 26.6*  PLT 218  --  208 147*  CREATININE 4.21*  --  4.12* 4.29*   < > = values in this interval not displayed.    Estimated Creatinine Clearance: 19.3 mL/min (A) (by C-G formula based on SCr of 4.29 mg/dL (H)).   Medications:  No PTA anticoagulation of record.  Pt has been on Enoxaparin 30mg  - last dose 0930 2153  Assessment: 59 yo male with COVID PNA and rising D-dimer and AKI. Scr since admission     Pharmacy monitoring therapeutic enoxaparin.  Scr since admission 5.78----->4.11-->4.29 (unclear baseline)   HGB currently stable @ 9.7 x 3 days  Goal of Therapy:   Monitor platelets by anticoagulation protocol: Yes   Plan:  Continue Lovenox 1mg /kg q24  Will monitor CBC and Scr daily while on therapeutic enoxaparin.   [id number], PharmD, BCPS Clinical Pharmacist 05/24/2020 8:02 AM

## 2020-05-24 NOTE — Consult Note (Signed)
PHARMACY CONSULT NOTE - FOLLOW UP  Pharmacy Consult for Electrolyte Monitoring and Replacement   Recent Labs: Potassium (mmol/L)  Date Value  05/24/2020 3.5   Magnesium (mg/dL)  Date Value  14/43/1540 2.2   Calcium (mg/dL)  Date Value  08/67/6195 8.0 (L)   Albumin (g/dL)  Date Value  09/32/6712 2.2 (L)   Sodium (mmol/L)  Date Value  05/24/2020 132 (L)     Assessment: NathanTidwellis a58 y.o.malewith a known history of COPD, hypertension and type 2 diabetes mellitus, who presented to the emergency room with acute onset of worsening dyspnea/COVID PNA and hyperglycemia.  BG currently 67-433 on prednisone, SSI, and glargine 55 units bid.  Pharmacy has been consulted to monitor/replenish K.  Goal of Therapy:  K ~ 4  Plan:  K 3.5  Mag 2.2 (10/3)  Scr 4.29 . No replacement at this time. Conservative due to renal fxn.   If trends down again consider replacing K with low dose.  Will follow up on AM labs including mag/phos for severe malnutrition.  Albina Billet, PharmD, BCPS Clinical Pharmacist 05/24/2020 8:07 AM

## 2020-05-25 ENCOUNTER — Inpatient Hospital Stay: Payer: Medicaid Other

## 2020-05-25 DIAGNOSIS — J9601 Acute respiratory failure with hypoxia: Secondary | ICD-10-CM | POA: Diagnosis not present

## 2020-05-25 DIAGNOSIS — U071 COVID-19: Secondary | ICD-10-CM | POA: Diagnosis not present

## 2020-05-25 LAB — CBC
HCT: 32.5 % — ABNORMAL LOW (ref 39.0–52.0)
Hemoglobin: 11.9 g/dL — ABNORMAL LOW (ref 13.0–17.0)
MCH: 29.2 pg (ref 26.0–34.0)
MCHC: 36.6 g/dL — ABNORMAL HIGH (ref 30.0–36.0)
MCV: 79.9 fL — ABNORMAL LOW (ref 80.0–100.0)
Platelets: 164 10*3/uL (ref 150–400)
RBC: 4.07 MIL/uL — ABNORMAL LOW (ref 4.22–5.81)
RDW: 13 % (ref 11.5–15.5)
WBC: 19.1 10*3/uL — ABNORMAL HIGH (ref 4.0–10.5)
nRBC: 0 % (ref 0.0–0.2)

## 2020-05-25 LAB — FIBRIN DERIVATIVES D-DIMER (ARMC ONLY): Fibrin derivatives D-dimer (ARMC): 1813.91 ng/mL (FEU) — ABNORMAL HIGH (ref 0.00–499.00)

## 2020-05-25 LAB — BASIC METABOLIC PANEL
Anion gap: 10 (ref 5–15)
Anion gap: 10 (ref 5–15)
BUN: 101 mg/dL — ABNORMAL HIGH (ref 6–20)
BUN: 87 mg/dL — ABNORMAL HIGH (ref 6–20)
CO2: 23 mmol/L (ref 22–32)
CO2: 23 mmol/L (ref 22–32)
Calcium: 8.1 mg/dL — ABNORMAL LOW (ref 8.9–10.3)
Calcium: 6.8 mg/dL — ABNORMAL LOW (ref 8.9–10.3)
Chloride: 100 mmol/L (ref 98–111)
Chloride: 103 mmol/L (ref 98–111)
Creatinine, Ser: 4.29 mg/dL — ABNORMAL HIGH (ref 0.61–1.24)
Creatinine, Ser: 4.46 mg/dL — ABNORMAL HIGH (ref 0.61–1.24)
GFR calc non Af Amer: 14 mL/min — ABNORMAL LOW (ref >60)
GFR calc non Af Amer: 14 mL/min — ABNORMAL LOW (ref >60)
Glucose, Bld: 21 mg/dL — CL (ref 70–99)
Glucose, Bld: 108 mg/dL — ABNORMAL HIGH (ref 70–99)
Potassium: 3.7 mmol/L (ref 3.5–5.1)
Potassium: 4.9 mmol/L (ref 3.5–5.1)
Sodium: 133 mmol/L — ABNORMAL LOW (ref 135–145)
Sodium: 136 mmol/L (ref 135–145)

## 2020-05-25 LAB — CBC WITH DIFFERENTIAL/PLATELET
Abs Immature Granulocytes: 0.16 10*3/uL — ABNORMAL HIGH (ref 0.00–0.07)
Basophils Absolute: 0 10*3/uL (ref 0.0–0.1)
Basophils Relative: 0 %
Eosinophils Absolute: 0 10*3/uL (ref 0.0–0.5)
Eosinophils Relative: 0 %
HCT: 28 % — ABNORMAL LOW (ref 39.0–52.0)
Hemoglobin: 9.4 g/dL — ABNORMAL LOW (ref 13.0–17.0)
Immature Granulocytes: 1 %
Lymphocytes Relative: 2 %
Lymphs Abs: 0.3 10*3/uL — ABNORMAL LOW (ref 0.7–4.0)
MCH: 28.5 pg (ref 26.0–34.0)
MCHC: 33.6 g/dL (ref 30.0–36.0)
MCV: 84.8 fL (ref 80.0–100.0)
Monocytes Absolute: 0.3 10*3/uL (ref 0.1–1.0)
Monocytes Relative: 2 %
Neutro Abs: 14.5 10*3/uL — ABNORMAL HIGH (ref 1.7–7.7)
Neutrophils Relative %: 95 %
Platelets: 183 10*3/uL (ref 150–400)
RBC: 3.3 MIL/uL — ABNORMAL LOW (ref 4.22–5.81)
RDW: 13.5 % (ref 11.5–15.5)
WBC: 15.2 10*3/uL — ABNORMAL HIGH (ref 4.0–10.5)
nRBC: 0.1 % (ref 0.0–0.2)

## 2020-05-25 LAB — BLOOD GAS, ARTERIAL
Acid-base deficit: 3.8 mmol/L — ABNORMAL HIGH (ref 0.0–2.0)
Acid-base deficit: 6 mmol/L — ABNORMAL HIGH (ref 0.0–2.0)
Bicarbonate: 21.5 mmol/L (ref 20.0–28.0)
Bicarbonate: 21.4 mmol/L (ref 20.0–28.0)
FIO2: 1
FIO2: 1
MECHVT: 500 mL
O2 Saturation: 96 %
O2 Saturation: 85.5 %
PEEP: 14 cmH2O
Patient temperature: 37
Patient temperature: 37
RATE: 34 resp/min
pCO2 arterial: 39 mmHg (ref 32.0–48.0)
pCO2 arterial: 50 mmHg — ABNORMAL HIGH (ref 32.0–48.0)
pH, Arterial: 7.35 (ref 7.350–7.450)
pH, Arterial: 7.24 — ABNORMAL LOW (ref 7.350–7.450)
pO2, Arterial: 86 mmHg (ref 83.0–108.0)
pO2, Arterial: 60 mmHg — ABNORMAL LOW (ref 83.0–108.0)

## 2020-05-25 LAB — OCCULT BLOOD X 1 CARD TO LAB, STOOL: Fecal Occult Bld: NEGATIVE

## 2020-05-25 LAB — GLUCOSE, CAPILLARY
Glucose-Capillary: 133 mg/dL — ABNORMAL HIGH (ref 70–99)
Glucose-Capillary: 15 mg/dL — CL (ref 70–99)
Glucose-Capillary: 232 mg/dL — ABNORMAL HIGH (ref 70–99)
Glucose-Capillary: 163 mg/dL — ABNORMAL HIGH (ref 70–99)
Glucose-Capillary: 63 mg/dL — ABNORMAL LOW (ref 70–99)
Glucose-Capillary: 27 mg/dL — CL (ref 70–99)
Glucose-Capillary: 130 mg/dL — ABNORMAL HIGH (ref 70–99)
Glucose-Capillary: 124 mg/dL — ABNORMAL HIGH (ref 70–99)
Glucose-Capillary: 42 mg/dL — CL (ref 70–99)
Glucose-Capillary: 83 mg/dL (ref 70–99)
Glucose-Capillary: 73 mg/dL (ref 70–99)
Glucose-Capillary: 65 mg/dL — ABNORMAL LOW (ref 70–99)

## 2020-05-25 LAB — TRIGLYCERIDES: Triglycerides: 93 mg/dL (ref <150)

## 2020-05-25 LAB — PROCALCITONIN: Procalcitonin: 4.24 ng/mL

## 2020-05-25 LAB — PHOSPHORUS: Phosphorus: 3.1 mg/dL (ref 2.5–4.6)

## 2020-05-25 LAB — MRSA PCR SCREENING: MRSA by PCR: NEGATIVE

## 2020-05-25 LAB — C-REACTIVE PROTEIN: CRP: 4.2 mg/dL — ABNORMAL HIGH (ref <1.0)

## 2020-05-25 LAB — MAGNESIUM: Magnesium: 2.1 mg/dL (ref 1.7–2.4)

## 2020-05-25 LAB — TROPONIN I (HIGH SENSITIVITY): Troponin I (High Sensitivity): 31 ng/L — ABNORMAL HIGH (ref <18)

## 2020-05-25 MED ORDER — DEXTROSE 50 % IV SOLN
1.0000 | Freq: Once | INTRAVENOUS | Status: AC
  Administered 2020-05-25: 11:00:00 50 mL via INTRAVENOUS

## 2020-05-25 MED ORDER — DEXMEDETOMIDINE HCL IN NACL 400 MCG/100ML IV SOLN
0.4000 ug/kg/h | INTRAVENOUS | Status: DC
  Filled 2020-05-25: qty 100

## 2020-05-25 MED ORDER — MIDAZOLAM HCL 2 MG/2ML IJ SOLN
2.0000 mg | Freq: Once | INTRAMUSCULAR | Status: AC
  Administered 2020-05-25: 2 mg via INTRAVENOUS
  Filled 2020-05-25: qty 2

## 2020-05-25 MED ORDER — LORAZEPAM 2 MG/ML IJ SOLN
INTRAMUSCULAR | Status: AC
  Filled 2020-05-25: qty 1

## 2020-05-25 MED ORDER — DEXMEDETOMIDINE HCL IN NACL 400 MCG/100ML IV SOLN
0.4000 ug/kg/h | INTRAVENOUS | Status: DC
  Administered 2020-05-25: 1 ug/kg/h via INTRAVENOUS

## 2020-05-25 MED ORDER — ORAL CARE MOUTH RINSE
15.0000 mL | Freq: Two times a day (BID) | OROMUCOSAL | Status: DC
  Administered 2020-05-25 (×2): 15 mL via OROMUCOSAL

## 2020-05-25 MED ORDER — ORAL CARE MOUTH RINSE
15.0000 mL | OROMUCOSAL | Status: DC
  Administered 2020-05-25 – 2020-05-26 (×4): 15 mL via OROMUCOSAL

## 2020-05-25 MED ORDER — PROPOFOL 1000 MG/100ML IV EMUL
INTRAVENOUS | Status: AC
  Administered 2020-05-25: 40 ug/kg/min via INTRAVENOUS
  Filled 2020-05-25: qty 100

## 2020-05-25 MED ORDER — DEXTROSE 50 % IV SOLN
INTRAVENOUS | Status: AC
  Administered 2020-05-25: 08:00:00 50 mL
  Filled 2020-05-25: qty 50

## 2020-05-25 MED ORDER — VASOPRESSIN 20 UNITS/100 ML INFUSION FOR SHOCK
0.0000 [IU]/min | INTRAVENOUS | Status: DC
  Administered 2020-05-25 – 2020-05-26 (×2): 0.03 [IU]/min via INTRAVENOUS
  Filled 2020-05-25 (×2): qty 100

## 2020-05-25 MED ORDER — DEXTROSE 50 % IV SOLN
INTRAVENOUS | Status: AC
  Filled 2020-05-25: qty 50

## 2020-05-25 MED ORDER — PROPOFOL 1000 MG/100ML IV EMUL
5.0000 ug/kg/min | INTRAVENOUS | Status: DC
  Administered 2020-05-25: 60 ug/kg/min via INTRAVENOUS
  Administered 2020-05-25: 30 ug/kg/min via INTRAVENOUS
  Filled 2020-05-25 (×2): qty 100

## 2020-05-25 MED ORDER — ROCURONIUM BROMIDE 50 MG/5ML IV SOLN
50.0000 mg | Freq: Once | INTRAVENOUS | Status: AC
  Administered 2020-05-25: 50 mg via INTRAVENOUS
  Filled 2020-05-25: qty 1

## 2020-05-25 MED ORDER — NOREPINEPHRINE 16 MG/250ML-% IV SOLN
0.0000 ug/min | INTRAVENOUS | Status: DC
  Administered 2020-05-25 – 2020-05-26 (×2): 40 ug/min via INTRAVENOUS
  Filled 2020-05-25 (×2): qty 250

## 2020-05-25 MED ORDER — MIDAZOLAM HCL 2 MG/2ML IJ SOLN
INTRAMUSCULAR | Status: AC
  Administered 2020-05-25: 2 mg via INTRAVENOUS
  Filled 2020-05-25: qty 2

## 2020-05-25 MED ORDER — LORAZEPAM 2 MG/ML IJ SOLN
1.0000 mg | Freq: Once | INTRAMUSCULAR | Status: AC
  Administered 2020-05-25: 1 mg via INTRAMUSCULAR

## 2020-05-25 MED ORDER — STERILE WATER FOR INJECTION IV SOLN
INTRAVENOUS | Status: DC
  Filled 2020-05-25: qty 150
  Filled 2020-05-25: qty 850

## 2020-05-25 MED ORDER — VANCOMYCIN HCL IN DEXTROSE 1-5 GM/200ML-% IV SOLN: 1000.0000 mg | INTRAVENOUS | Status: DC

## 2020-05-25 MED ORDER — DEXTROSE-NACL 5-0.45 % IV SOLN: INTRAVENOUS | Status: DC

## 2020-05-25 MED ORDER — FENTANYL CITRATE (PF) 100 MCG/2ML IJ SOLN
100.0000 ug | Freq: Once | INTRAMUSCULAR | Status: AC
  Administered 2020-05-25: 100 ug via INTRAVENOUS
  Filled 2020-05-25: qty 2

## 2020-05-25 MED ORDER — DEXTROSE-NACL 5-0.9 % IV SOLN: INTRAVENOUS | Status: DC

## 2020-05-25 MED ORDER — HALOPERIDOL LACTATE 5 MG/ML IJ SOLN
2.0000 mg | Freq: Four times a day (QID) | INTRAMUSCULAR | Status: DC | PRN
  Administered 2020-05-25: 2 mg via INTRAMUSCULAR

## 2020-05-25 MED ORDER — DEXTROSE 50 % IV SOLN
INTRAVENOUS | Status: AC
  Administered 2020-05-25: 50 mL via INTRAVENOUS
  Filled 2020-05-25: qty 50

## 2020-05-25 MED ORDER — DOPAMINE-DEXTROSE 3.2-5 MG/ML-% IV SOLN
INTRAVENOUS | Status: AC
  Filled 2020-05-25: qty 250

## 2020-05-25 MED ORDER — DEXTROSE 50 % IV SOLN: 1.0000 | Freq: Once | INTRAVENOUS | Status: AC

## 2020-05-25 MED ORDER — VANCOMYCIN HCL 1250 MG/250ML IV SOLN
1250.0000 mg | Freq: Once | INTRAVENOUS | Status: AC
  Administered 2020-05-25: 1250 mg via INTRAVENOUS
  Filled 2020-05-25: qty 250

## 2020-05-25 MED ORDER — CHLORHEXIDINE GLUCONATE 0.12 % MT SOLN
15.0000 mL | Freq: Two times a day (BID) | OROMUCOSAL | Status: DC
  Administered 2020-05-25: 15 mL via OROMUCOSAL

## 2020-05-25 MED ORDER — SODIUM CHLORIDE 0.9 % IV SOLN: 250.0000 mL | INTRAVENOUS | Status: DC

## 2020-05-25 MED ORDER — SODIUM CHLORIDE 0.9 % IV SOLN
500.0000 mg | INTRAVENOUS | Status: DC
  Filled 2020-05-25: qty 500

## 2020-05-25 MED ORDER — NOREPINEPHRINE 4 MG/250ML-% IV SOLN
2.0000 ug/min | INTRAVENOUS | Status: DC
  Administered 2020-05-25: 30 ug/min via INTRAVENOUS
  Filled 2020-05-25 (×2): qty 250

## 2020-05-25 MED ORDER — ETOMIDATE 2 MG/ML IV SOLN
20.0000 mg | Freq: Once | INTRAVENOUS | Status: AC
  Administered 2020-05-25: 20 mg via INTRAVENOUS
  Filled 2020-05-25: qty 10

## 2020-05-25 MED ORDER — SODIUM CHLORIDE 0.9 % IV SOLN
1.0000 g | INTRAVENOUS | Status: DC
  Filled 2020-05-25: qty 10

## 2020-05-25 MED ORDER — MIDAZOLAM HCL 2 MG/2ML IJ SOLN: 2.0000 mg | INTRAMUSCULAR | Status: DC | PRN

## 2020-05-25 MED ORDER — DEXTROSE 10 % IV SOLN: INTRAVENOUS | Status: DC

## 2020-05-25 MED ORDER — MORPHINE SULFATE (PF) 2 MG/ML IV SOLN
INTRAVENOUS | Status: DC
Start: 2020-05-25 — End: 2020-05-25
  Filled 2020-05-25: qty 1

## 2020-05-25 MED ORDER — SODIUM CHLORIDE 0.9% FLUSH: 10.0000 mL | INTRAVENOUS | Status: DC | PRN

## 2020-05-25 MED ORDER — SODIUM CHLORIDE 0.9 % IV SOLN
2.0000 g | INTRAVENOUS | Status: DC
  Administered 2020-05-25: 2 g via INTRAVENOUS
  Filled 2020-05-25 (×2): qty 2

## 2020-05-25 MED ORDER — POTASSIUM CHLORIDE CRYS ER 20 MEQ PO TBCR: 20.0000 meq | EXTENDED_RELEASE_TABLET | Freq: Once | ORAL | Status: DC

## 2020-05-25 MED ORDER — NOREPINEPHRINE 4 MG/250ML-% IV SOLN
INTRAVENOUS | Status: AC
  Administered 2020-05-25: 5 ug/min via INTRAVENOUS
  Filled 2020-05-25: qty 250

## 2020-05-25 MED ORDER — FENTANYL 2500MCG IN NS 250ML (10MCG/ML) PREMIX INFUSION
0.0000 ug/h | INTRAVENOUS | Status: DC
  Administered 2020-05-25: 300 ug/h via INTRAVENOUS
  Administered 2020-05-25: 100 ug/h via INTRAVENOUS
  Filled 2020-05-25 (×2): qty 250

## 2020-05-25 MED ORDER — EPINEPHRINE HCL 5 MG/250ML IV SOLN IN NS
0.5000 ug/min | INTRAVENOUS | Status: DC
  Administered 2020-05-25 (×2): 5 ug/min via INTRAVENOUS
  Administered 2020-05-26: 04:00:00 20 ug/min via INTRAVENOUS
  Administered 2020-05-26: 14 ug/min via INTRAVENOUS
  Filled 2020-05-25 (×3): qty 250

## 2020-05-25 MED ORDER — CHLORHEXIDINE GLUCONATE 0.12% ORAL RINSE (MEDLINE KIT)
15.0000 mL | Freq: Two times a day (BID) | OROMUCOSAL | Status: DC
  Administered 2020-05-25: 15 mL via OROMUCOSAL

## 2020-05-25 MED ORDER — CHLORHEXIDINE GLUCONATE CLOTH 2 % EX PADS
6.0000 | MEDICATED_PAD | Freq: Every day | CUTANEOUS | Status: DC
  Administered 2020-05-25: 6 via TOPICAL

## 2020-05-25 MED ORDER — NOREPINEPHRINE 4 MG/250ML-% IV SOLN: 0.0000 ug/min | INTRAVENOUS | Status: DC

## 2020-05-25 MED ORDER — SODIUM CHLORIDE 0.9% FLUSH
10.0000 mL | Freq: Two times a day (BID) | INTRAVENOUS | Status: DC
  Administered 2020-05-25: 10 mL

## 2020-05-25 NOTE — Progress Notes (Signed)
OT Cancellation Note  Patient Details Name: Jackson Stark MRN: [id number] DOB: September 21, 1960   Cancelled Treatment:    Reason Eval/Treat Not Completed: Medical issues which prohibited therapy  Pt t/f to higher level of care d/t change in medical status. Will complete OT order per protocol. Thank you.  Rejeana Brock, MS, OTR/L ascom [phone] 05/25/20, 11:43 AM

## 2020-05-25 NOTE — Progress Notes (Signed)
PROGRESS NOTE    Jackson Stark  YSA:[id number] DOB: 30-Jun-1961 DOA: 05/19/2020 PCP: Patient, No Pcp Per  Brief Narrative:  Jackson Stark is a 59 year old male with past medical history notable for COPD, essential hypertension, type 2 diabetes mellitus who presented to the emergency department with progressive shortness of breath associated with dry cough and generalized weakness.  Also with recurrent falls.  In the ED, patient was noted to be hypoxic with SPO2 80% on room air with improvement to 93% on 10 L NRB.  10/6: Assumed care of patient today.  At 07 100 this morning rapid response called for severe hypoglycemia.  Patient noted to be hypoglycemic down to 15.  IV access was temporarily loss of glucose was administered in chair muscularly with good response blood sugars.  Patient's mental status is very poor this morning.  He is agitated pulling at lines.  He is redirectable but quickly tries to get out of bed.  Oxygen status tenuous.  Requiring 15 L nonrebreather +15 L high flow nasal cannula.   Assessment & Plan:   Active Problems:   Acute hypoxemic respiratory failure due to COVID-19 [facility])   Malnutrition of moderate degree   AKI (acute kidney injury) (HCC)   Generalized weakness   DM (diabetes mellitus), secondary uncontrolled (HCC)   Acute respiratory failure with hypoxia (HCC)  Acute metabolic encephalopathy Patient had acute worsening of mental status this morning, 05/25/2020.  Hypoglycemia should contributing however suspect multifactorial etiology of encephalopathy.  Hospital-acquired delirium versus hypoxia versus steroid effect.  IM Ativan x1 and IM Haldol administered for acute agitation.  ABG does not demonstrate hypercarbia Plan: One-to-one sitter ordered Delirium precautions Haldol 2 mg IV every 6 hours as needed Frequent reorienting measures   Acute hypoxic respiratory failure secondary to acute Covid-19 viral pneumonia during the ongoing 2020 Covid 19 Pandemic  - POA Patient presenting to ED with progressive shortness of breath.  Was found to be hypoxic with SPO2 80% on room air on arrival.  Chest x-ray consistent with Covid-19 viral pneumonia; with repeat x-ray on 05/20/2020 showing worsening progression of multifocal pneumonia. --COVID test: + 04/20/2020 --CRP 8.7>6.2>4.5>3.0>2.6>7.4>3.8>1.7>3.2 --ddimer 1,079>905>925>1475>2118>2049>1592>1742>1627>1813 --Completed 5-day course of remdesivir on 05/19/2020 --Unable to start baricitinib 2/2 renal insufficiency/acute renal failure Plan: --Continue Solu-Medrol 60 mg IV q12h --prone for 2-3hrs every 12hrs if able --Continue supplemental oxygen, titrate to maintain SPO2 greater than 85%, on 15L HFNC + 15L NRB --If patient continues to require this oxygen rate can transition to heated high flow nasal cannula.  Discussed with RN and RT --Continue supportive care with albuterol MDI prn, vitamin C, zinc, Tylenol, antitussives (benzonatate/ Mucinex/Tussionex) --Follow CBC, CMP, D-dimer, ferritin, and CRP daily --Continue airborne/contact isolation precautions for 3 weeks from the day of diagnosis  Elevated D-dimer D-dimer now trending up despite treatment for Covid-19, 905>925>1475>2118.  Concern of blood clot whether DVT versus PE given known hypercoagulable state with Covid-19 infection coupled with poor mobility.  Unable to obtain CT angiogram chest due to renal failure.  Unable to obtain nuclear medicine VQ scan due to patient's inability to lay flat for 20 minutes with resultant worsening of his hypoxia.  Venous duplex vascular ultrasound bilateral lower extremities negative for DVT. Plan: Continue treatment dose Lovenox, started 05/20/2020 Follow daily D-dimer  Acute renal failure Unknown baseline, poor outpatient follow-up/medical noncompliance.  Presented with creatinine 5.59.  Renal ultrasound with no acute abnormality. --Nephrology following, appreciate assistance --Cr  5.59>4.41>4.11>4.15>4.21>4.12>4.29 --Discontinued IV fluids on 10/1 due to worsening hypoxemia Plan: --Avoid  nephrotoxins, renally dose all medications --Close monitoring of urinary output --Follow BMP daily --No IV fluids  Hypokalemia Potassium 3.5 today, will replete --Repeat electrolytes in a.m. to include magnesium  Type 2 diabetes mellitus, poor control Acute hypoglycemia Hemoglobin A1c 13.8, poorly controlled. -Had episode of hypoglycemia on 05/25/2020.  Glucose dropped to 15 Plan: Hold scheduled Lantus Hold scheduled NovoLog Continue Tradjenta 5 mg p.o. daily Continue resistant sliding scale Continue CBG before meals and at bedtime Carb modified diet  Essential hypertension, poorly controlled BP poorly controlled  --amlodipine 10 mg p.o. daily --Hydralazine to 100 mg p.o. TID --Coreg to 12.5mg  BID --Continue monitor BP closely and adjust as needed  Tobacco use disorder Counseled.  DVT prophylaxis: Lovenox 1 mg/kg every 12 hours Code Status: Full Family Communication: significant other Shella MaximFelicia Cates [phone] on 05/25/20 Disposition Plan: Status is: Inpatient  Remains inpatient appropriate because:Altered mental status and Inpatient level of care appropriate due to severity of illness   Dispo:  Patient From: Home  Planned Disposition: Home with Health Care Svc  Expected discharge date: >3 days  Medically stable for discharge: No   Continues to be altered and severely hypoxic.  Respiratory status tenuous, acute desaturation noted 10/6.  Prognosis guarded.  Dispo plan pending   Consultants:   None  Procedures:   None  Antimicrobials: Remdesivir, complete Rocephin, started 10/6 Azithromycin, started 10/6  Subjective: Patient seen and examined.  Agitated this morning.  Was redirectable.  States he wants to go home.  Objective: Vitals:   05/25/20 0430 05/25/20 0501 05/25/20 0724 05/25/20 0745  BP: (!) 151/70  (!) 138/45 (!) 144/60  Pulse:  84  76 81  Resp: (!) 24  (!) 23 (!) 23  Temp: 99.4 F (37.4 C)     TempSrc:      SpO2: (!) 86% 91% 92% (!) 86%  Weight:      Height:        Intake/Output Summary (Last 24 hours) at 05/25/2020 1030 Last data filed at 05/24/2020 1148 Gross per 24 hour  Intake --  Output 300 ml  Net -300 ml   Filed Weights   04/23/2020 1707 05/16/20 1539  Weight: 77.1 kg 71.7 kg    Examination:  General exam: Agitated, confused Respiratory system: Diffuse coarse crackles bl, increased WOB, 15L HFNC + 15L NRB Cardiovascular system: S1 & S2 heard, RRR. No JVD, murmurs, rubs, gallops or clicks. No pedal edema. Gastrointestinal system: Abdomen is nondistended, soft and nontender. No organomegaly or masses felt. Normal bowel sounds heard. Central nervous system:Alert to person.  Unable to perform rest of exam Extremities: Unable to examine Skin: No rashes, lesions or ulcers Psychiatry: Agitated, confused  Data Reviewed: I have personally reviewed following labs and imaging studies  CBC: Recent Labs  Lab 05/19/20 0320 05/19/20 0320 05/20/20 0359 05/20/20 0359 05/21/20 0540 05/22/20 0401 05/23/20 0432 05/24/20 0411 05/25/20 0540  WBC 8.0   < > 8.0   < > 6.8 9.9 24.8* 11.9* 19.1*  NEUTROABS 7.3  --  6.8  --   --   --   --   --   --   HGB 10.9*   < > 10.8*   < > 10.5* 9.7* 9.7* 9.7* 11.9*  HCT 28.6*   < > 29.1*   < > 28.4* 28.1* 27.7* 26.6* 32.5*  MCV 75.9*   < > 77.6*   < > 77.8* 81.7 81.2 79.2* 79.9*  PLT 194   < > 191   < > 225 218  208 147* 164   < > = values in this interval not displayed.   Basic Metabolic Panel: Recent Labs  Lab 05/19/20 0320 05/19/20 0320 05/20/20 0359 05/20/20 0359 05/21/20 0540 05/22/20 0401 05/23/20 0432 05/24/20 0411 05/25/20 0540  NA 137   < > 137   < > 132* 132* 131* 132* 133*  K 3.0*   < > 3.6   < > 3.8 3.7 3.6 3.5 3.7  CL 101   < > 103   < > 98 97* 95* 97* 100  CO2 22   < > 23   < > 22 25 26 23 23   GLUCOSE 126*   < > 209*   < > 311* 226* 113* 135*  21*  BUN 103*   < > 99*   < > 97* 97* 104* 101* 101*  CREATININE 4.41*   < > 4.11*   < > 4.15* 4.21* 4.12* 4.29* 4.29*  CALCIUM 8.1*   < > 8.0*   < > 8.3* 8.0* 8.0* 8.0* 8.1*  MG 2.0  --  2.2  --  2.3 2.2  --   --  2.1  PHOS  --   --   --   --   --   --   --   --  3.1   < > = values in this interval not displayed.   GFR: Estimated Creatinine Clearance: 19.3 mL/min (A) (by C-G formula based on SCr of 4.29 mg/dL (H)). Liver Function Tests: Recent Labs  Lab 05/19/20 0320 05/20/20 0359  AST 25 37  ALT 15 22  ALKPHOS 74 69  BILITOT 0.7 0.7  PROT 5.3* 5.1*  ALBUMIN 2.2* 2.2*   No results for input(s): LIPASE, AMYLASE in the last 168 hours. No results for input(s): AMMONIA in the last 168 hours. Coagulation Profile: No results for input(s): INR, PROTIME in the last 168 hours. Cardiac Enzymes: No results for input(s): CKTOTAL, CKMB, CKMBINDEX, TROPONINI in the last 168 hours. BNP (last 3 results) No results for input(s): PROBNP in the last 8760 hours. HbA1C: No results for input(s): HGBA1C in the last 72 hours. CBG: Recent Labs  Lab 05/25/20 0645 05/25/20 0654 05/25/20 0703 05/25/20 0753 05/25/20 0825  GLUCAP 15* 232* 133* 63* 163*   Lipid Profile: No results for input(s): CHOL, HDL, LDLCALC, TRIG, CHOLHDL, LDLDIRECT in the last 72 hours. Thyroid Function Tests: No results for input(s): TSH, T4TOTAL, FREET4, T3FREE, THYROIDAB in the last 72 hours. Anemia Panel: No results for input(s): VITAMINB12, FOLATE, FERRITIN, TIBC, IRON, RETICCTPCT in the last 72 hours. Sepsis Labs: No results for input(s): PROCALCITON, LATICACIDVEN in the last 168 hours.  Recent Results (from the past 240 hour(s))  Respiratory Panel by RT PCR (Flu A&B, Covid) - Nasopharyngeal Swab     Status: Abnormal   Collection Time: 07-Jun-2020  5:14 PM   Specimen: Nasopharyngeal Swab  Result Value Ref Range Status   SARS Coronavirus 2 by RT PCR POSITIVE (A) NEGATIVE Final    Comment: RESULT CALLED TO, READ  BACK BY AND VERIFIED WITH: LORRIE LEMONS ON 2020-06-07 AT 1851 QSD (NOTE) SARS-CoV-2 target nucleic acids are DETECTED.  SARS-CoV-2 RNA is generally detectable in upper respiratory specimens  during the acute phase of infection. Positive results are indicative of the presence of the identified virus, but do not rule out bacterial infection or co-infection with other pathogens not detected by the test. Clinical correlation with patient history and other diagnostic information is necessary to determine patient infection status. The  expected result is Negative.  Fact Sheet for Patients:  https://www.moore.com/  Fact Sheet for Healthcare Providers: https://www.young.biz/  This test is not yet approved or cleared by the [place] FDA and  has been authorized for detection and/or diagnosis of SARS-CoV-2 by FDA under an Emergency Use Authorization (EUA).  This EUA will remain in effect (meaning this test can b e used) for the duration of  the COVID-19 declaration under Section 564(b)(1) of the Act, 21 U.S.C. section 360bbb-3(b)(1), unless the authorization is terminated or revoked sooner.      Influenza A by PCR NEGATIVE NEGATIVE Final   Influenza B by PCR NEGATIVE NEGATIVE Final    Comment: (NOTE) The Xpert Xpress SARS-CoV-2/FLU/RSV assay is intended as an aid in  the diagnosis of influenza from Nasopharyngeal swab specimens and  should not be used as a sole basis for treatment. Nasal washings and  aspirates are unacceptable for Xpert Xpress SARS-CoV-2/FLU/RSV  testing.  Fact Sheet for Patients: https://www.moore.com/  Fact Sheet for Healthcare Providers: https://www.young.biz/  This test is not yet approved or cleared by the [place] FDA and  has been authorized for detection and/or diagnosis of SARS-CoV-2 by  FDA under an Emergency Use Authorization (EUA). This EUA will remain  in effect  (meaning this test can be used) for the duration of the  Covid-19 declaration under Section 564(b)(1) of the Act, 21  U.S.C. section 360bbb-3(b)(1), unless the authorization is  terminated or revoked. Performed at [facility], [address] [place], [place] [address]   Blood culture (routine x 2)     Status: None   Collection Time: 17-May-2020  5:14 PM   Specimen: BLOOD  Result Value Ref Range Status   Specimen Description BLOOD LEFT ARM  Final   Special Requests   Final    BOTTLES DRAWN AEROBIC AND ANAEROBIC Blood Culture adequate volume   Culture   Final    NO GROWTH 5 DAYS Performed at [facility], [address] [place], [place] [address]    Report Status 05/20/2020 FINAL  Final  Blood culture (routine x 2)     Status: None   Collection Time: 05-17-2020  5:14 PM   Specimen: BLOOD  Result Value Ref Range Status   Specimen Description BLOOD RIGHT ARM  Final   Special Requests   Final    BOTTLES DRAWN AEROBIC AND ANAEROBIC Blood Culture adequate volume   Culture   Final    NO GROWTH 5 DAYS Performed at [facility], [address] [place], [place] [address]    Report Status 05/20/2020 FINAL  Final         Radiology Studies: DG Chest 1 View  Result Date: 05/25/2020 CLINICAL DATA:  Shortness of breath.  COVID-19 positive EXAM: CHEST  1 VIEW COMPARISON:  05/20/2020 FINDINGS: Stable cardiomediastinal contours. Persistent patchy airspace opacities throughout both lungs, left worse than right. Findings have worsened within the peripheral aspect of the right mid to lower lung field. No appreciable pleural fluid collection. No pneumothorax. IMPRESSION: Multifocal pneumonia with interval worsening from prior. Electronically Signed   By: Duanne Guess D.O.   On: 05/25/2020 08:02        Scheduled Meds: . amLODipine  10 mg Oral Daily  . vitamin C  500 mg Oral Daily  . aspirin EC  81 mg Oral Daily  . carvedilol  12.5 mg Oral BID WC  . dextrose       . enoxaparin (LOVENOX) injection  1 mg/kg  Subcutaneous Q24H  . famotidine  10 mg Oral Daily  . feeding supplement (GLUCERNA SHAKE)  237 mL Oral TID BM  . fluticasone  2 spray Each Nare Daily  . guaiFENesin  600 mg Oral BID  . hydrALAZINE  100 mg Oral TID  . influenza vac split quadrivalent PF  0.5 mL Intramuscular Tomorrow-1000  . insulin aspart  0-20 Units Subcutaneous TID WC  . insulin aspart  0-5 Units Subcutaneous QHS  . linagliptin  5 mg Oral Daily  . LORazepam      . LORazepam      . methylPREDNISolone (SOLU-MEDROL) injection  60 mg Intravenous Q12H  . multivitamin with minerals  1 tablet Oral Daily  . pneumococcal 23 valent vaccine  0.5 mL Intramuscular Tomorrow-1000  . potassium chloride  20 mEq Oral Once  . zinc sulfate  220 mg Oral Daily   Continuous Infusions:   LOS: 10 days    Time spent: 35 minutes    Tresa Moore, MD [facility] Pager [phone]  If 7PM-7AM, please contact night-coverage 05/25/2020, 10:30 AM

## 2020-05-25 NOTE — Progress Notes (Signed)
PT Cancellation Note  Patient Details Name: Jackson Stark MRN: [id number] DOB: 05/27/1961   Cancelled Treatment:   PT attempt. PT hold. Pt is not appropriate to participate in PT at this time. Glucose 21 and pt is requiring increased O2 support. Will hold PT this date and return at later time/date when pt is more appropriate.       ANTRON SETH 05/25/2020, 10:00 AM

## 2020-05-25 NOTE — Significant Event (Addendum)
CODE BLUE DOCUMENTATION  19:04 Pt is severely hypoxic with O2 sats in the 40's-50's, pt went into PEA arrest.  CPR and ACLS began immediately.  Pt already intubated.  Received epinephrine and bicarb.  ROSC obtained at 19:08.   19:27 Pt again with PEA arrest.  CPR and ACLS began immediately.  Pt received 1 mg Epi with ROSC at 19:30.  Dr. Jayme Cloud discussed multiple cardiac arrests, poor prognosis and pt unlikely having meaningful outcome with further resuscitative efforts with pt's significant other via Telephone.  Significant understands critical illness and poor prognosis, and elected to make pt DNR/DNI, and to withhold any further resuscitative efforts and to NOT ESCALATE CARE ANY FURTHER.       Harlon Ditty, AGACNP-BC [facility] Pager: [phone]

## 2020-05-25 NOTE — Progress Notes (Signed)
CH paged for rapid response; CH called to check in w/unit and to request page if chaplain support is needed, as CH also paged for CODE STEMI in ED just before.  CH remains available and will follow-up if needed.

## 2020-05-25 NOTE — Progress Notes (Signed)
CRITICAL VALUE ALERT  Critical Value:  Glucose 21  Date & Time Notied:  05/25/2020  Provider Notified: NP, Reyes Ivan aware. With pt in room currently rapid in progress  Orders Received/Actions taken: Dextrose given

## 2020-05-25 NOTE — Progress Notes (Signed)
Spoke with pt's significant other Shella Maxim for update.  She understands that pt is critically ill with grave prognosis given severe COVID, and multiorgan failure s/p 2 cardiac arrests.  He is on maximal medical support, and will likely need CRRT in the near future.  She would like to change pt's code status back to FULL CODE for now.  Orders placed.      Harlon Ditty, AGACNP-BC [facility] Pager: [phone]

## 2020-05-25 NOTE — Consult Note (Addendum)
Reason for Consult: Acute hypoxic respiratory failure due to COVID-19 Referring Physician: Lolita Patella, MD  Jackson Stark is an 59 y.o. male.   Please note that the history is obtained from available records as the patient cannot provide history due to acuity of condition.  HPI:  The patient is a 59 year old, former smoker, who presented to [facility] 10 days ago with COVID-19 pneumonia.  He has been managed in the Covid unit.  Today the patient developed issues with hypoglycemia and a rapid response was called.  This issue resolved quickly however over the course of the morning the patient had few other rapid response calls with the last calls being about severe hypoxemia.  The patient was transferred to the intensive care unit for further management.  He was noted to be exceedingly tachypneic and obtunded.  He required intubation and mechanical ventilation to protect airway and treat refractory hypoxemia.  The patient now remains in the ICU intubated and mechanically ventilated.  Patient was also noted to have significant hypotension not responsive to fluids.  Past Medical History:  Diagnosis Date  . COPD (chronic obstructive pulmonary disease) (HCC)   . Diabetes mellitus without complication (HCC)   . Hypertension     History reviewed. No pertinent surgical history.  History reviewed. No pertinent family history.  Social History   Tobacco Use  . Smoking status: Former Games developer  . Smokeless tobacco: Never Used  Substance Use Topics  . Alcohol use: Not Currently   Past drug use   Allergies: Not on File  Medications: I have reviewed the patient's current medications.  Results for orders placed or performed during the hospital encounter of 05/02/2020 (from the past 48 hour(s))  Glucose, capillary     Status: Abnormal   Collection Time: 05/23/20  9:16 PM  Result Value Ref Range   Glucose-Capillary 304 (H) 70 - 99 mg/dL    Comment: Glucose reference range applies only to samples  taken after fasting for at least 8 hours.  CBC     Status: Abnormal   Collection Time: 05/24/20  4:11 AM  Result Value Ref Range   WBC 11.9 (H) 4.0 - 10.5 K/uL   RBC 3.36 (L) 4.22 - 5.81 MIL/uL   Hemoglobin 9.7 (L) 13.0 - 17.0 g/dL   HCT 16.1 (L) 39 - 52 %   MCV 79.2 (L) 80.0 - 100.0 fL   MCH 28.9 26.0 - 34.0 pg   MCHC 36.5 (H) 30.0 - 36.0 g/dL   RDW 09.6 04.5 - 40.9 %   Platelets 147 (L) 150 - 400 K/uL   nRBC 0.0 0.0 - 0.2 %    Comment: Performed at [facility], [address], [place], [place] [address]  Basic metabolic panel     Status: Abnormal   Collection Time: 05/24/20  4:11 AM  Result Value Ref Range   Sodium 132 (L) 135 - 145 mmol/L   Potassium 3.5 3.5 - 5.1 mmol/L   Chloride 97 (L) 98 - 111 mmol/L   CO2 23 22 - 32 mmol/L   Glucose, Bld 135 (H) 70 - 99 mg/dL    Comment: Glucose reference range applies only to samples taken after fasting for at least 8 hours.   BUN 101 (H) 6 - 20 mg/dL    Comment: RESULT CONFIRMED BY MANUAL DILUTION HNM   Creatinine, Ser 4.29 (H) 0.61 - 1.24 mg/dL   Calcium 8.0 (L) 8.9 - 10.3 mg/dL   GFR calc non Af Amer 14 (L) >60  mL/min   GFR calc Af Amer 17 (L) >60 mL/min   Anion gap 12 5 - 15    Comment: Performed at [facility], [address] [place], [place] [address]  Fibrin derivatives D-Dimer [facility] only)     Status: Abnormal   Collection Time: 05/24/20  4:11 AM  Result Value Ref Range   Fibrin derivatives D-dimer ([facility]) 1,627.43 (H) 0.00 - 499.00 ng/mL (FEU)    Comment: (NOTE) <> Exclusion of Venous Thromboembolism (VTE) - OUTPATIENT ONLY   (Emergency Department or [place])    0-499 ng/ml (FEU): With a low to intermediate pretest probability                      for VTE this test result excludes the diagnosis                      of VTE.   >499 ng/ml (FEU) : VTE not excluded; additional work up for VTE is                      required.  <> Testing on Inpatients and Evaluation of Disseminated Intravascular    Coagulation (DIC) Reference Range:   0-499 ng/ml (FEU) Performed at [facility], [address] [place], [place] [address]   C-reactive protein     Status: Abnormal   Collection Time: 05/24/20  4:11 AM  Result Value Ref Range   CRP 3.2 (H) <1.0 mg/dL    Comment: Performed at [facility], [address] [place], [place] [address]  Glucose, capillary     Status: Abnormal   Collection Time: 05/24/20  7:49 AM  Result Value Ref Range   Glucose-Capillary 67 (L) 70 - 99 mg/dL    Comment: Glucose reference range applies only to samples taken after fasting for at least 8 hours.  Glucose, capillary     Status: Abnormal   Collection Time: 05/24/20 11:32 AM  Result Value Ref Range   Glucose-Capillary 208 (H) 70 - 99 mg/dL    Comment: Glucose reference range applies only to samples taken after fasting for at least 8 hours.   Comment 1 Notify RN   Glucose, capillary     Status: Abnormal   Collection Time: 05/24/20  3:49 PM  Result Value Ref Range   Glucose-Capillary 230 (H) 70 - 99 mg/dL    Comment: Glucose reference range applies only to samples taken after fasting for at least 8 hours.  Glucose, capillary     Status: Abnormal   Collection Time: 05/24/20  8:03 PM  Result Value Ref Range   Glucose-Capillary 275 (H) 70 - 99 mg/dL    Comment: Glucose reference range applies only to samples taken after fasting for at least 8 hours.  CBC     Status: Abnormal   Collection Time: 05/25/20  5:40 AM  Result Value Ref Range   WBC 19.1 (H) 4.0 - 10.5 K/uL   RBC 4.07 (L) 4.22 - 5.81 MIL/uL   Hemoglobin 11.9 (L) 13.0 - 17.0 g/dL   HCT 91.4 (L) 39 - 52 %   MCV 79.9 (L) 80.0 - 100.0 fL   MCH 29.2 26.0 - 34.0 pg   MCHC 36.6 (H) 30.0 - 36.0 g/dL   RDW 78.2 95.6 - 21.3 %   Platelets 164 150 - 400 K/uL   nRBC 0.0 0.0 - 0.2 %    Comment: Performed at [facility], [address] [place], [place] [address]  Basic metabolic panel     Status: Abnormal   Collection Time:  05/25/20  5:40 AM  Result Value Ref Range   Sodium 133 (L) 135 - 145 mmol/L   Potassium 3.7 3.5 - 5.1 mmol/L   Chloride 100 98 - 111 mmol/L   CO2 23 22 - 32 mmol/L   Glucose, Bld 21 (LL) 70 - 99 mg/dL    Comment: CRITICAL RESULT CALLED TO, READ BACK BY AND VERIFIED WITH BRANDI MILLER AT 0713 ON 05/25/2020 [facility]. Glucose reference range applies only to samples taken after fasting for at least 8 hours.    BUN 101 (H) 6 - 20 mg/dL    Comment: RESULT CONFIRMED BY MANUAL DILUTION HNM   Creatinine, Ser 4.29 (H) 0.61 - 1.24 mg/dL   Calcium 8.1 (L) 8.9 - 10.3 mg/dL   GFR calc non Af Amer 14 (L) >60 mL/min   Anion gap 10 5 - 15    Comment: Performed at [facility], [address] [place], [place] [address]  Fibrin derivatives D-Dimer [facility] only)     Status: Abnormal   Collection Time: 05/25/20  5:40 AM  Result Value Ref Range   Fibrin derivatives D-dimer ([facility]) 1,813.91 (H) 0.00 - 499.00 ng/mL (FEU)    Comment: (NOTE) <> Exclusion of Venous Thromboembolism (VTE) - OUTPATIENT ONLY   (Emergency Department or [place])    0-499 ng/ml (FEU): With a low to intermediate pretest probability                      for VTE this test result excludes the diagnosis                      of VTE.   >499 ng/ml (FEU) : VTE not excluded; additional work up for VTE is                      required.  <> Testing on Inpatients and Evaluation of Disseminated Intravascular   Coagulation (DIC) Reference Range:   0-499 ng/ml (FEU) Performed at [facility], [address] [place], [place] [address]   C-reactive protein     Status: Abnormal   Collection Time: 05/25/20  5:40 AM  Result Value Ref Range   CRP 4.2 (H) <1.0 mg/dL    Comment: Performed at [facility], [address] [place], [place] [address]  Phosphorus     Status: None   Collection Time: 05/25/20  5:40 AM  Result Value Ref Range   Phosphorus 3.1 2.5 - 4.6 mg/dL    Comment: Performed at [facility], [address] [place], [place] [address]  Magnesium     Status: None   Collection Time: 05/25/20  5:40 AM  Result Value Ref Range   Magnesium 2.1 1.7 - 2.4 mg/dL    Comment: Performed at [facility], [address] [place], [place] [address]  Procalcitonin - Baseline     Status: None   Collection Time: 05/25/20  5:40 AM  Result Value Ref Range   Procalcitonin 4.24 ng/mL    Comment:        Interpretation: PCT > 2 ng/mL: Systemic infection (sepsis) is likely, unless other causes are known. (NOTE)       Sepsis PCT Algorithm           Lower Respiratory Tract  Infection PCT Algorithm    ----------------------------     ----------------------------         PCT < 0.25 ng/mL                PCT < 0.10 ng/mL          Strongly encourage             Strongly discourage   discontinuation of antibiotics    initiation of antibiotics    ----------------------------     -----------------------------       PCT 0.25 - 0.50 ng/mL            PCT 0.10 - 0.25 ng/mL               OR       >80% decrease in PCT            Discourage initiation of                                            antibiotics      Encourage discontinuation           of antibiotics    ----------------------------     -----------------------------         PCT >= 0.50 ng/mL              PCT 0.26 - 0.50 ng/mL               AND       <80% decrease in PCT              Encourage initiation of                                             antibiotics       Encourage continuation           of antibiotics    ----------------------------     -----------------------------        PCT >= 0.50 ng/mL                  PCT > 0.50 ng/mL               AND         increase in PCT                  Strongly encourage                                      initiation of antibiotics    Strongly encourage escalation           of antibiotics                                     -----------------------------                                            PCT <= 0.25 ng/mL  OR                                        > 80% decrease in PCT                                      Discontinue / Do not initiate                                             antibiotics  Performed at [facility], [address] [place], [place] [address]   Glucose, capillary     Status: Abnormal   Collection Time: 05/25/20  6:45 AM  Result Value Ref Range   Glucose-Capillary 15 (LL) 70 - 99 mg/dL    Comment: Glucose reference range applies only to samples taken after fasting for at least 8 hours.  Glucose, capillary     Status: Abnormal   Collection Time: 05/25/20  6:54 AM  Result Value Ref Range   Glucose-Capillary 232 (H) 70 - 99 mg/dL    Comment: Glucose reference range applies only to samples taken after fasting for at least 8 hours.  Glucose, capillary     Status: Abnormal   Collection Time: 05/25/20  7:03 AM  Result Value Ref Range   Glucose-Capillary 133 (H) 70 - 99 mg/dL    Comment: Glucose reference range applies only to samples taken after fasting for at least 8 hours.  Blood gas, arterial     Status: Abnormal   Collection Time: 05/25/20  7:32 AM  Result Value Ref Range   FIO2 1.00    Delivery systems NON-REBREATHER OXYGEN MASK    pH, Arterial 7.35 7.35 - 7.45   pCO2 arterial 39 32 - 48 mmHg   pO2, Arterial 86 83 - 108 mmHg   Bicarbonate 21.5 20.0 - 28.0 mmol/L   Acid-base deficit 3.8 (H) 0.0 - 2.0 mmol/L   O2 Saturation 96.0 %   Patient temperature 37.0    Collection site LEFT BRACHIAL    Sample type ARTHROGRAPHIS SPECIES    Allens test (pass/fail) PASS PASS    Comment: Performed at [facility], [address] [place], [place] [address]  Glucose, capillary     Status: Abnormal   Collection Time: 05/25/20  7:53 AM  Result Value Ref Range   Glucose-Capillary 63 (L) 70 - 99 mg/dL    Comment: Glucose reference range applies only to  samples taken after fasting for at least 8 hours.  Glucose, capillary     Status: Abnormal   Collection Time: 05/25/20  8:25 AM  Result Value Ref Range   Glucose-Capillary 163 (H) 70 - 99 mg/dL    Comment: Glucose reference range applies only to samples taken after fasting for at least 8 hours.  Triglycerides     Status: None   Collection Time: 05/25/20 10:46 AM  Result Value Ref Range   Triglycerides 93 <150 mg/dL    Comment: Performed at [facility], [address] [place], [place] [address]  Glucose, capillary     Status: Abnormal   Collection Time: 05/25/20 11:21 AM  Result Value Ref Range   Glucose-Capillary 27 (LL) 70 - 99 mg/dL  Comment: Glucose reference range applies only to samples taken after fasting for at least 8 hours.   Comment 1 Notify RN   Glucose, capillary     Status: Abnormal   Collection Time: 05/25/20 11:42 AM  Result Value Ref Range   Glucose-Capillary 130 (H) 70 - 99 mg/dL    Comment: Glucose reference range applies only to samples taken after fasting for at least 8 hours.  Occult blood card to lab, stool     Status: None   Collection Time: 05/25/20 12:09 PM  Result Value Ref Range   Fecal Occult Bld NEGATIVE NEGATIVE    Comment: Performed at [facility], [address] [place], [place] [address]  MRSA PCR Screening     Status: None   Collection Time: 05/25/20 12:15 PM   Specimen: Nasopharyngeal  Result Value Ref Range   MRSA by PCR NEGATIVE NEGATIVE    Comment:        The GeneXpert MRSA Assay (FDA approved for NASAL specimens only), is one component of a comprehensive MRSA colonization surveillance program. It is not intended to diagnose MRSA infection nor to guide or monitor treatment for MRSA infections. Performed at [facility], [address] [place], [place] [address]   Blood gas, arterial     Status: Abnormal   Collection Time: 05/25/20  3:03 PM  Result Value Ref Range   FIO2 1.00    Delivery systems  VENTILATOR    Mode PRESSURE REGULATED VOLUME CONTROL    VT 500 mL   LHR 34 resp/min   Peep/cpap 14.0 cm H20   pH, Arterial 7.24 (L) 7.35 - 7.45   pCO2 arterial 50 (H) 32 - 48 mmHg   pO2, Arterial 60 (L) 83 - 108 mmHg   Bicarbonate 21.4 20.0 - 28.0 mmol/L   Acid-base deficit 6.0 (H) 0.0 - 2.0 mmol/L   O2 Saturation 85.5 %   Patient temperature 37.0    Collection site RIGHT RADIAL    Sample type ARTERIAL DRAW    Allens test (pass/fail) PASS PASS    Comment: Performed at [facility], [address] [place], [place] [address]  Glucose, capillary     Status: Abnormal   Collection Time: 05/25/20  5:48 PM  Result Value Ref Range   Glucose-Capillary 39 (LL) 70 - 99 mg/dL    Comment: Glucose reference range applies only to samples taken after fasting for at least 8 hours.   Comment 1 Notify RN    Comment 2 Repeat Test   Glucose, capillary     Status: Abnormal   Collection Time: 05/25/20  5:49 PM  Result Value Ref Range   Glucose-Capillary 42 (LL) 70 - 99 mg/dL    Comment: Glucose reference range applies only to samples taken after fasting for at least 8 hours.   Comment 1 Notify RN   Glucose, capillary     Status: Abnormal   Collection Time: 05/25/20  6:22 PM  Result Value Ref Range   Glucose-Capillary 124 (H) 70 - 99 mg/dL    Comment: Glucose reference range applies only to samples taken after fasting for at least 8 hours.  Blood gas, arterial     Status: Abnormal (Preliminary result)   Collection Time: 05/25/20  7:11 PM  Result Value Ref Range   FIO2 1.00    pH, Arterial 7.19 (LL) 7.35 - 7.45    Comment: CRITICAL RESULT CALLED TO, READ BACK BY AND VERIFIED WITH: STAT CODE BLUIE CSM RRT  pCO2 arterial 63 (H) 32 - 48 mmHg   pO2, Arterial 75 (L) 83 - 108 mmHg   Bicarbonate 24.1 20.0 - 28.0 mmol/L   Acid-base deficit 4.6 (H) 0.0 - 2.0 mmol/L   O2 Saturation 90.9 %   Patient temperature 37.0     Comment: Performed at [facility], [address]  [place], [place] [address]   Collection site PENDING    Sample type PENDING    Allens test (pass/fail) PENDING PASS   Mechanical Rate PENDING   Glucose, capillary     Status: None   Collection Time: 05/25/20  7:23 PM  Result Value Ref Range   Glucose-Capillary 83 70 - 99 mg/dL    Comment: Glucose reference range applies only to samples taken after fasting for at least 8 hours.    DG Chest 1 View  Result Date: 05/25/2020 CLINICAL DATA:  Shortness of breath.  COVID-19 positive EXAM: CHEST  1 VIEW COMPARISON:  05/20/2020 FINDINGS: Stable cardiomediastinal contours. Persistent patchy airspace opacities throughout both lungs, left worse than right. Findings have worsened within the peripheral aspect of the right mid to lower lung field. No appreciable pleural fluid collection. No pneumothorax. IMPRESSION: Multifocal pneumonia with interval worsening from prior. Electronically Signed   By: Duanne Guess D.O.   On: 05/25/2020 08:02   DG Abd 1 View  Result Date: 05/25/2020 CLINICAL DATA:  OG tube placement EXAM: ABDOMEN - 1 VIEW COMPARISON:  None. FINDINGS: Enteric tube terminates in the proximal gastric body. Nonobstructive bowel gas pattern. Visualized osseous structures are within normal limits. IMPRESSION: Enteric tube terminates in the proximal gastric body. Electronically Signed   By: Charline Bills M.D.   On: 05/25/2020 14:09   DG Chest Port 1 View  Result Date: 05/25/2020 CLINICAL DATA:  Central line placement. EXAM: PORTABLE CHEST 1 VIEW COMPARISON:  Earlier this day. FINDINGS: Tip of the right internal jugular central venous catheter projects over the lower SVC. No pneumothorax. Endotracheal tube and enteric tubes remain in place. Stable appearance of heterogeneous bilateral airspace opacities. Upper normal heart size. No evidence of pneumomediastinum. No large pleural effusion. IMPRESSION: 1. Tip of the right internal jugular central venous catheter projects over the lower SVC. No  pneumothorax. 2. Unchanged bilateral heterogeneous airspace opacities, consistent with COVID pneumonia. Electronically Signed   By: Narda Rutherford M.D.   On: 05/25/2020 17:38   DG Chest Port 1 View  Result Date: 05/25/2020 CLINICAL DATA:  Intubation, COVID positive EXAM: PORTABLE CHEST 1 VIEW COMPARISON:  05/25/2020 at 0749 hours FINDINGS: Endotracheal tube terminates 3.5 cm above the carina. Enteric tube courses into the stomach. Multifocal pneumonia with sparing of the lung apices. No pleural effusion or pneumothorax. The heart is normal in size. IMPRESSION: Endotracheal tube terminates 3.5 cm above the carina. Multifocal pneumonia in this patient with known COVID. Electronically Signed   By: Charline Bills M.D.   On: 05/25/2020 14:09    Review of Systems  Unable to obtain due to the acuity of condition, patient now intubated and mechanically ventilated.  Blood pressure (!) 45/34, pulse 62, temperature (!) 96.4 F (35.8 C), resp. rate (!) 0, height 5\' 10"  (1.778 m), weight 71.7 kg, SpO2 (!) 65 %. Physical Exam physical examination is limited due to need for PPE/CAPR GENERAL: Disheveled man, obtunded, almost agonal respirations. HEAD: Normocephalic, atraumatic.  EYES: Pupils equal, round, reactive to light.  No scleral icterus.  MOUTH: Very poor dentition, partials in place. NECK: Supple. No thyromegaly. Trachea midline. No JVD.  No adenopathy. PULMONARY: Good air entry bilaterally.  Coarse breath sounds otherwise no adventitious sounds. CARDIOVASCULAR: S1 and S2. Regular rate and rhythm on monitor.  ABDOMEN: Nondistended, benign. MUSCULOSKELETAL: No joint deformity, no clubbing, no edema.  NEUROLOGIC: Patient appears to have no overt focal deficit.   SKIN: Intact,warm,dry.  Multiple tattoos.  Severe onychomycosis. PSYCH: Cannot assess due to acuity of condition.   Assessment/Plan:  Acute hypoxemic respiratory failure due to COVID-19 pneumonia ARDS due to COVID-19 Continue  supportive care ARDS net protocol, continue 6 mL/kg of predicted body weight PEEP as plateau pressures tolerate SAT/SBT when meets criteria VAP protocol  Acute on chronic renal  Failure Monitor renal function Avoid nephrotoxins  Prophylaxis Lovenox Famotidine     C. Danice Goltz, MD [place] [facility] 05/25/2020, 7:33 PM

## 2020-05-25 NOTE — Procedures (Signed)
Intubation Procedure Note  MARRELL DICAPRIO  [id number]  15-Dec-1960  Date:05/25/20  Time:2:09 PM   Provider Performing:Jontay Maston L Rust-Chester    Procedure: Intubation (31500)  Indication(s) Respiratory Failure  Consent Unable to obtain consent due to emergent nature of procedure.   Anesthesia Etomidate, Versed, Fentanyl and Rocuronium   Time Out Verified patient identification, verified procedure, site/side was marked, verified correct patient position, special equipment/implants available, medications/allergies/relevant history reviewed, required imaging and test results available.   Sterile Technique Usual hand hygeine, masks, and gloves were used   Procedure Description Patient positioned in bed supine.  Sedation given as noted above.  Patient was intubated with 8.0 endotracheal tube at 26 using a Mac 4 Glidescope.  View was Grade 1 full glottis .  Number of attempts was 1.  Colorimetric CO2 detector was consistent with tracheal placement.   Complications/Tolerance None; patient tolerated the procedure well. Chest X-ray is ordered to verify placement.   EBL Minimal   Specimen(s) None  Domingo Pulse Rust-Chester, AGACNP-BC [place]  [facility]    Please see Amion for pager details.

## 2020-05-25 NOTE — Procedures (Signed)
Central Venous Catheter Placement:TRIPLE LUMEN   Procedure: Insertion of Non-tunneled Central Venous Catheter(36556) with US guidance (10272)   Indication(s) Medication administration and difficult access  Patient requiring multiple pressors.Patient has limited or no vascular access.    Consent:emergent   Anesthesia Patient sedated on mechanical ventilation, analgesia already on board   Timeout Verified patient identification, verified procedure, site/side was marked, verified correct patient position, special equipment/implants available, medications/allergies/relevant history reviewed, required imaging and test results available. Patient comfort was obtained.     Sterile Technique Maximal sterile technique including full sterile barrier drape, hand hygiene, sterile gown, sterile gloves, mask, hair covering, sterile ultrasound probe cover (if used).  Full PPE with CAPR.   Hand washing performed prior to starting the procedure.    Procedure Description Area of catheter insertion was cleaned with chlorhexidine and draped in sterile fashion.  With real-time ultrasound guidance a central venous catheter was placed into the right internal jugular vein. Nonpulsatile blood flow and easy flushing noted in all ports.  The catheter was sutured in place and sterile dressing applied.  2 attempts necessary.   A triple lumen catheter was placed in RIGHT internal Jugular Vein There was good blood return, catheter caps were placed on lumens, catheter flushed easily, the line was secured and a sterile dressing and BIO-PATCH applied.    Complications/Tolerance None; patient tolerated the procedure well. Chest X-ray showed line in appropriate position.  No pneumothorax.    EBL Minimal   Specimen(s) None   Number of Attempts:2, first attempt by NP. Complications:none Estimated Blood Loss: none Chest Radiograph indicated and ordered.  Operator: Rust-Chester/Jae Skeet   C. Danice Goltz,  MD [place] [facility]

## 2020-05-25 NOTE — Consult Note (Signed)
PHARMACY CONSULT NOTE - FOLLOW UP  Pharmacy Consult for Electrolyte Monitoring and Replacement   Recent Labs: Potassium (mmol/L)  Date Value  05/25/2020 3.7   Magnesium (mg/dL)  Date Value  70/35/0093 2.1   Calcium (mg/dL)  Date Value  81/82/9937 8.1 (L)   Albumin (g/dL)  Date Value  16/96/7893 2.2 (L)   Phosphorus (mg/dL)  Date Value  81/08/7508 3.1   Sodium (mmol/L)  Date Value  05/25/2020 133 (L)     Assessment: NathanTidwellis a58 y.o.malewith a known history of COPD, hypertension and type 2 diabetes mellitus, who presented to the emergency room with acute onset of worsening dyspnea/COVID PNA and hyperglycemia.  BG currently 67-433 on prednisone, SSI, and glargine 50 units bid.  Pharmacy has been consulted to monitor/replenish K.  10/5 K 3.5 - pt dosed KCl 10/6 K 3.7  Goal of Therapy:  K ~ 4  Plan:  Potassium still borderline with high insulin requirements, making better UOP.  Will dose KCl and reassess with am labs  Albina Billet, PharmD, BCPS Clinical Pharmacist 05/25/2020 8:04 AM

## 2020-05-25 NOTE — Progress Notes (Signed)
[facility]  ROUNDING NOTE   Subjective:  Patient appears lethargic,hardly arousable to call today. He is on non- breather, respirations labored. SpO2 95%. He had an episode of hypoglycemia, with Blood sugar reading down to 21  this morning,which got  treated with 50% Dextrose IV.     = 10/05 0701 - 10/06 0700 In: -  Out: 600 [Urine:600]  Lab Results  Component Value Date   CREATININE 4.29 (H) 05/25/2020   CREATININE 4.29 (H) 05/24/2020   CREATININE 4.12 (H) 05/23/2020     Objective:  Vital signs in last 24 hours:  Temp:  [97.8 F (36.6 C)-99.4 F (37.4 C)] 99.4 F (37.4 C) (10/06 0430) Pulse Rate:  [76-95] 81 (10/06 0745) Resp:  [18-24] 23 (10/06 0745) BP: (123-151)/(45-70) 144/60 (10/06 0745) SpO2:  [83 %-99 %] 86 % (10/06 0745)  Weight change:  Filed Weights   04/30/2020 1707 05/16/20 1539  Weight: 77.1 kg 71.7 kg    Intake/Output: I/O last 3 completed shifts: In: -  Out: 1700 [Urine:1700]   Intake/Output this shift:  No intake/output data recorded.  Physical Exam: General:  Lethargic, lying in bed with eyes closed  Head: Non re-breather mask in place  Eyes: Anicteric  Lungs:   Respiration labored,dependent on non rebreather with 100 % O2  Heart:  Regular,HR in 90's  Abdomen:  Soft, non distended  Extremities: No  peripheral edema.  Neurologic: Lethargic, attempts to open eyes when called by name  Skin: No acute rashes or  lesions       Basic Metabolic Panel: Recent Labs  Lab 05/19/20 0320 05/19/20 0320 05/20/20 0359 05/20/20 0359 05/21/20 0540 05/21/20 0540 05/22/20 0401 05/22/20 0401 05/23/20 0432 05/24/20 0411 05/25/20 0540  NA 137   < > 137   < > 132*  --  132*  --  131* 132* 133*  K 3.0*   < > 3.6   < > 3.8  --  3.7  --  3.6 3.5 3.7  CL 101   < > 103   < > 98  --  97*  --  95* 97* 100  CO2 22   < > 23   < > 22  --  25  --  26 23 23   GLUCOSE 126*   < > 209*   < > 311*  --  226*  --  113* 135* 21*  BUN 103*   < > 99*   <  > 97*  --  97*  --  104* 101* 101*  CREATININE 4.41*   < > 4.11*   < > 4.15*  --  4.21*  --  4.12* 4.29* 4.29*  CALCIUM 8.1*   < > 8.0*   < > 8.3*   < > 8.0*   < > 8.0* 8.0* 8.1*  MG 2.0  --  2.2  --  2.3  --  2.2  --   --   --  2.1  PHOS  --   --   --   --   --   --   --   --   --   --  3.1   < > = values in this interval not displayed.    Liver Function Tests: Recent Labs  Lab 05/19/20 0320 05/20/20 0359  AST 25 37  ALT 15 22  ALKPHOS 74 69  BILITOT 0.7 0.7  PROT 5.3* 5.1*  ALBUMIN 2.2* 2.2*   No results for input(s): LIPASE, AMYLASE in the last  168 hours. No results for input(s): AMMONIA in the last 168 hours.  CBC: Recent Labs  Lab 05/19/20 0320 05/19/20 0320 05/20/20 0359 05/20/20 0359 05/21/20 0540 05/22/20 0401 05/23/20 0432 05/24/20 0411 05/25/20 0540  WBC 8.0   < > 8.0   < > 6.8 9.9 24.8* 11.9* 19.1*  NEUTROABS 7.3  --  6.8  --   --   --   --   --   --   HGB 10.9*   < > 10.8*   < > 10.5* 9.7* 9.7* 9.7* 11.9*  HCT 28.6*   < > 29.1*   < > 28.4* 28.1* 27.7* 26.6* 32.5*  MCV 75.9*   < > 77.6*   < > 77.8* 81.7 81.2 79.2* 79.9*  PLT 194   < > 191   < > 225 218 208 147* 164   < > = values in this interval not displayed.    Cardiac Enzymes: No results for input(s): CKTOTAL, CKMB, CKMBINDEX, TROPONINI in the last 168 hours.  BNP: Invalid input(s): POCBNP  CBG: Recent Labs  Lab 05/25/20 0645 05/25/20 0654 05/25/20 0703 05/25/20 0753 05/25/20 0825  GLUCAP 15* 232* 133* 63* 163*    Microbiology: Results for orders placed or performed during the hospital encounter of 04/22/2020  Respiratory Panel by RT PCR (Flu A&B, Covid) - Nasopharyngeal Swab     Status: Abnormal   Collection Time: 04/29/2020  5:14 PM   Specimen: Nasopharyngeal Swab  Result Value Ref Range Status   SARS Coronavirus 2 by RT PCR POSITIVE (A) NEGATIVE Final    Comment: RESULT CALLED TO, READ BACK BY AND VERIFIED WITH: LORRIE LEMONS ON 04/27/2020 AT 1851 QSD (NOTE) SARS-CoV-2 target nucleic  acids are DETECTED.  SARS-CoV-2 RNA is generally detectable in upper respiratory specimens  during the acute phase of infection. Positive results are indicative of the presence of the identified virus, but do not rule out bacterial infection or co-infection with other pathogens not detected by the test. Clinical correlation with patient history and other diagnostic information is necessary to determine patient infection status. The expected result is Negative.  Fact Sheet for Patients:  https://www.moore.com/  Fact Sheet for Healthcare Providers: https://www.young.biz/  This test is not yet approved or cleared by the [place] FDA and  has been authorized for detection and/or diagnosis of SARS-CoV-2 by FDA under an Emergency Use Authorization (EUA).  This EUA will remain in effect (meaning this test can b e used) for the duration of  the COVID-19 declaration under Section 564(b)(1) of the Act, 21 U.S.C. section 360bbb-3(b)(1), unless the authorization is terminated or revoked sooner.      Influenza A by PCR NEGATIVE NEGATIVE Final   Influenza B by PCR NEGATIVE NEGATIVE Final    Comment: (NOTE) The Xpert Xpress SARS-CoV-2/FLU/RSV assay is intended as an aid in  the diagnosis of influenza from Nasopharyngeal swab specimens and  should not be used as a sole basis for treatment. Nasal washings and  aspirates are unacceptable for Xpert Xpress SARS-CoV-2/FLU/RSV  testing.  Fact Sheet for Patients: https://www.moore.com/  Fact Sheet for Healthcare Providers: https://www.young.biz/  This test is not yet approved or cleared by the [place] FDA and  has been authorized for detection and/or diagnosis of SARS-CoV-2 by  FDA under an Emergency Use Authorization (EUA). This EUA will remain  in effect (meaning this test can be used) for the duration of the  Covid-19 declaration under Section 564(b)(1) of  the Act, 21  U.S.C. section 360bbb-3(b)(1),  unless the authorization is  terminated or revoked. Performed at [facility], [address] [place], [place] [address]   Blood culture (routine x 2)     Status: None   Collection Time: 06/09/20  5:14 PM   Specimen: BLOOD  Result Value Ref Range Status   Specimen Description BLOOD LEFT ARM  Final   Special Requests   Final    BOTTLES DRAWN AEROBIC AND ANAEROBIC Blood Culture adequate volume   Culture   Final    NO GROWTH 5 DAYS Performed at [facility], [address], [place], [place] [address]    Report Status 05/20/2020 FINAL  Final  Blood culture (routine x 2)     Status: None   Collection Time: June 09, 2020  5:14 PM   Specimen: BLOOD  Result Value Ref Range Status   Specimen Description BLOOD RIGHT ARM  Final   Special Requests   Final    BOTTLES DRAWN AEROBIC AND ANAEROBIC Blood Culture adequate volume   Culture   Final    NO GROWTH 5 DAYS Performed at [facility], [address], [place], [place] [address]    Report Status 05/20/2020 FINAL  Final    Coagulation Studies: No results for input(s): LABPROT, INR in the last 72 hours.  Urinalysis: No results for input(s): COLORURINE, LABSPEC, PHURINE, GLUCOSEU, HGBUR, BILIRUBINUR, KETONESUR, PROTEINUR, UROBILINOGEN, NITRITE, LEUKOCYTESUR in the last 72 hours.  Invalid input(s): APPERANCEUR    Imaging: DG Chest 1 View  Result Date: 05/25/2020 CLINICAL DATA:  Shortness of breath.  COVID-19 positive EXAM: CHEST  1 VIEW COMPARISON:  05/20/2020 FINDINGS: Stable cardiomediastinal contours. Persistent patchy airspace opacities throughout both lungs, left worse than right. Findings have worsened within the peripheral aspect of the right mid to lower lung field. No appreciable pleural fluid collection. No pneumothorax. IMPRESSION: Multifocal pneumonia with interval worsening from prior. Electronically Signed   By: Duanne Guess D.O.   On: 05/25/2020 08:02      Medications:    . amLODipine  10 mg Oral Daily  . vitamin C  500 mg Oral Daily  . aspirin EC  81 mg Oral Daily  . carvedilol  12.5 mg Oral BID WC  . dextrose      . enoxaparin (LOVENOX) injection  1 mg/kg Subcutaneous Q24H  . famotidine  10 mg Oral Daily  . feeding supplement (GLUCERNA SHAKE)  237 mL Oral TID BM  . fluticasone  2 spray Each Nare Daily  . guaiFENesin  600 mg Oral BID  . hydrALAZINE  100 mg Oral TID  . influenza vac split quadrivalent PF  0.5 mL Intramuscular Tomorrow-1000  . insulin aspart  0-20 Units Subcutaneous TID WC  . insulin aspart  0-5 Units Subcutaneous QHS  . linagliptin  5 mg Oral Daily  . LORazepam      . LORazepam      . methylPREDNISolone (SOLU-MEDROL) injection  60 mg Intravenous Q12H  . multivitamin with minerals  1 tablet Oral Daily  . pneumococcal 23 valent vaccine  0.5 mL Intramuscular Tomorrow-1000  . potassium chloride  20 mEq Oral Once  . zinc sulfate  220 mg Oral Daily   acetaminophen, chlorpheniramine-HYDROcodone, guaiFENesin-dextromethorphan, haloperidol lactate, magnesium hydroxide, ondansetron **OR** ondansetron (ZOFRAN) IV, sodium chloride, traZODone  Assessment/ Plan:  Mr. KAITLIN ARDITO is a 59 y.o.  male presented from home for shortness of breath and increasing weakness.  Hypoxic on room air as per EMS.  Diagnosed with Covid pneumonia.  He has recurrent  falls, nasal congestion.  Lab results showed acidosis, hyponatremia, hypokalemia, increased creatinine, low albumin.h/o diabetes for 20 years, was not compliant with medications for the past one year.  #Acute kidney injury Lab Results  Component Value Date   CREATININE 4.29 (H) 05/25/2020   CREATININE 4.29 (H) 05/24/2020   CREATININE 4.12 (H) 05/23/2020   Creatinine level stays at 4.29 today Urine output down to 600 ml  #Diabetes type 2, poorly controlled Last HbA1C: 13.8 on 05/16/20 Patient had an episode of hypoglycemia this morning which got treated with Dextrose  50% Close monitoring of Blood glucose  Management per medicine team #Multifocal pneumonia secondary to COVID-19 On non re-breather currently SOB worse today   #Hypokalemia Potassium today is 3.7 today Will continue monitoring     LOS: 10 Jameal Razzano 10/6/202110:19 AM

## 2020-05-25 NOTE — Progress Notes (Signed)
°   05/25/20 1920  Clinical Encounter Type  Visited With Patient;Health care provider  Visit Type Initial  Referral From Nurse  Consult/Referral To Chaplain  Chaplain responded to CODE BLUE and stood on the outside of room praying.

## 2020-05-25 NOTE — Consult Note (Signed)
Pharmacy Antibiotic Note  Jackson Stark is a 59 y.o. male admitted on 05/01/2020 with COVID-19 PNA.  Pharmacy has been consulted for vancomycin and cefepime dosing. Since admission his renal function has improved slightly with an unclear baseline. This morning he was transferred to CCU for severe hypoglycemia with increased O2 demands requiring 15 L nonrebreather +15 L high flow nasal cannula.  Plan:  1) start vancomycin 1250 mg IV x 1 then 1000 mg IV Q 48 hrs  Ke: 0.020, T1/2: 24 h  Css (calculated): 31.1/11.9 mcg/mL  Daily SCr while on vancomycin to assess renal function  vancomycin levels as clinically indicated  F/U MRSA PCR results for potential de-escalation  2) start cefepime 2 grams IV every 24 hours  Height: 5\' 10"  (177.8 cm) Weight: 71.7 kg (158 lb 1.1 oz) (bed scale) IBW/kg (Calculated) : 73  Temp (24hrs), Avg:96.4 F (35.8 C), Min:88.7 F (31.5 C), Max:99.4 F (37.4 C)  Recent Labs  Lab 05/21/20 0540 05/22/20 0401 05/23/20 0432 05/24/20 0411 05/25/20 0540  WBC 6.8 9.9 24.8* 11.9* 19.1*  CREATININE 4.15* 4.21* 4.12* 4.29* 4.29*    Estimated Creatinine Clearance: 19.3 mL/min (A) (by C-G formula based on SCr of 4.29 mg/dL (H)).    Not on File  Antimicrobials this admission: remdesivir 09/27 >> 09/30 vancomycin 10/06 >>  cefepime 10/06 >>   Microbiology results: 09/26 BCx: NG final 09/26 SARS CoV-2: positive 09/26 influenza A/B: negative   10/06 MRSA PCR: pending  Thank you for allowing pharmacy to be a part of this patient's care.  12/06 05/25/2020 12:21 PM

## 2020-05-26 ENCOUNTER — Inpatient Hospital Stay: Payer: Medicaid Other

## 2020-05-26 LAB — BLOOD GAS, ARTERIAL
Acid-base deficit: 5.6 mmol/L — ABNORMAL HIGH (ref 0.0–2.0)
Bicarbonate: 22.9 mmol/L (ref 20.0–28.0)
FIO2: 1
O2 Saturation: 73.4 %
Patient temperature: 37
pCO2 arterial: 60 mmHg — ABNORMAL HIGH (ref 32.0–48.0)
pH, Arterial: 7.19 — CL (ref 7.350–7.450)
pO2, Arterial: 49 mmHg — ABNORMAL LOW (ref 83.0–108.0)

## 2020-05-26 LAB — PHOSPHORUS: Phosphorus: 7.5 mg/dL — ABNORMAL HIGH (ref 2.5–4.6)

## 2020-05-26 LAB — URINALYSIS, COMPLETE (UACMP) WITH MICROSCOPIC
Bilirubin Urine: NEGATIVE
Glucose, UA: NEGATIVE mg/dL
Hgb urine dipstick: NEGATIVE
Ketones, ur: NEGATIVE mg/dL
Nitrite: NEGATIVE
Protein, ur: 100 mg/dL — AB
Specific Gravity, Urine: 1.017 (ref 1.005–1.030)
pH: 5 (ref 5.0–8.0)

## 2020-05-26 LAB — CBC WITH DIFFERENTIAL/PLATELET
Abs Immature Granulocytes: 0.27 10*3/uL — ABNORMAL HIGH (ref 0.00–0.07)
Basophils Absolute: 0.1 10*3/uL (ref 0.0–0.1)
Basophils Relative: 0 %
Eosinophils Absolute: 0 10*3/uL (ref 0.0–0.5)
Eosinophils Relative: 0 %
HCT: 29.6 % — ABNORMAL LOW (ref 39.0–52.0)
Hemoglobin: 9.7 g/dL — ABNORMAL LOW (ref 13.0–17.0)
Immature Granulocytes: 1 %
Lymphocytes Relative: 1 %
Lymphs Abs: 0.3 10*3/uL — ABNORMAL LOW (ref 0.7–4.0)
MCH: 28.5 pg (ref 26.0–34.0)
MCHC: 32.8 g/dL (ref 30.0–36.0)
MCV: 87.1 fL (ref 80.0–100.0)
Monocytes Absolute: 0.3 10*3/uL (ref 0.1–1.0)
Monocytes Relative: 2 %
Neutro Abs: 19.1 10*3/uL — ABNORMAL HIGH (ref 1.7–7.7)
Neutrophils Relative %: 96 %
Platelets: 178 10*3/uL (ref 150–400)
RBC: 3.4 MIL/uL — ABNORMAL LOW (ref 4.22–5.81)
RDW: 13.8 % (ref 11.5–15.5)
Smear Review: NORMAL
WBC: 20 10*3/uL — ABNORMAL HIGH (ref 4.0–10.5)
nRBC: 0.2 % (ref 0.0–0.2)

## 2020-05-26 LAB — PROCALCITONIN: Procalcitonin: 7.78 ng/mL

## 2020-05-26 LAB — COMPREHENSIVE METABOLIC PANEL
ALT: 171 U/L — ABNORMAL HIGH (ref 0–44)
AST: 207 U/L — ABNORMAL HIGH (ref 15–41)
Albumin: 1.8 g/dL — ABNORMAL LOW (ref 3.5–5.0)
Alkaline Phosphatase: 71 U/L (ref 38–126)
Anion gap: 12 (ref 5–15)
BUN: 105 mg/dL — ABNORMAL HIGH (ref 6–20)
CO2: 24 mmol/L (ref 22–32)
Calcium: 6.7 mg/dL — ABNORMAL LOW (ref 8.9–10.3)
Chloride: 101 mmol/L (ref 98–111)
Creatinine, Ser: 4.74 mg/dL — ABNORMAL HIGH (ref 0.61–1.24)
GFR calc non Af Amer: 13 mL/min — ABNORMAL LOW (ref >60)
Glucose, Bld: 103 mg/dL — ABNORMAL HIGH (ref 70–99)
Potassium: 5.4 mmol/L — ABNORMAL HIGH (ref 3.5–5.1)
Sodium: 137 mmol/L (ref 135–145)
Total Bilirubin: 1 mg/dL (ref 0.3–1.2)
Total Protein: 4.5 g/dL — ABNORMAL LOW (ref 6.5–8.1)

## 2020-05-26 LAB — C-REACTIVE PROTEIN: CRP: 21 mg/dL — ABNORMAL HIGH (ref <1.0)

## 2020-05-26 LAB — TROPONIN I (HIGH SENSITIVITY)
Troponin I (High Sensitivity): 107 ng/L (ref <18)
Troponin I (High Sensitivity): 137 ng/L (ref <18)

## 2020-05-26 LAB — THYROID PANEL WITH TSH
Free Thyroxine Index: 1.7 (ref 1.2–4.9)
T3 Uptake Ratio: 31 % (ref 24–39)
T4, Total: 5.5 ug/dL (ref 4.5–12.0)
TSH: 0.036 u[IU]/mL — ABNORMAL LOW (ref 0.450–4.500)

## 2020-05-26 LAB — GLUCOSE, CAPILLARY
Glucose-Capillary: 39 mg/dL — CL (ref 70–99)
Glucose-Capillary: 83 mg/dL (ref 70–99)
Glucose-Capillary: 87 mg/dL (ref 70–99)

## 2020-05-26 LAB — FIBRIN DERIVATIVES D-DIMER (ARMC ONLY): Fibrin derivatives D-dimer (ARMC): 6525.68 ng/mL (FEU) — ABNORMAL HIGH (ref 0.00–499.00)

## 2020-05-26 LAB — MAGNESIUM: Magnesium: 2 mg/dL (ref 1.7–2.4)

## 2020-05-26 MED ORDER — HYDROCORTISONE NA SUCCINATE PF 100 MG IJ SOLR
50.0000 mg | Freq: Four times a day (QID) | INTRAMUSCULAR | Status: DC
  Administered 2020-05-26: 50 mg via INTRAVENOUS

## 2020-05-26 MED ORDER — INSULIN ASPART 100 UNIT/ML ~~LOC~~ SOLN
10.0000 [IU] | Freq: Once | SUBCUTANEOUS | Status: AC
  Administered 2020-05-26: 10 [IU] via INTRAVENOUS
  Filled 2020-05-26 (×2): qty 0.1

## 2020-05-26 MED ORDER — VECURONIUM BROMIDE 10 MG IV SOLR
10.0000 mg | INTRAVENOUS | Status: DC | PRN
  Administered 2020-05-26: 10 mg via INTRAVENOUS

## 2020-05-26 MED ORDER — DEXTROSE 50 % IV SOLN
1.0000 | Freq: Once | INTRAVENOUS | Status: AC
  Administered 2020-05-26: 50 mL via INTRAVENOUS
  Filled 2020-05-26: qty 50

## 2020-05-26 MED ORDER — HYDROCORTISONE NA SUCCINATE PF 100 MG IJ SOLR
INTRAMUSCULAR | Status: AC
  Filled 2020-05-26: qty 2

## 2020-05-26 MED ORDER — PATIROMER SORBITEX CALCIUM 8.4 G PO PACK
16.8000 g | PACK | Freq: Every day | ORAL | Status: DC
  Filled 2020-05-26: qty 2

## 2020-05-26 MED ORDER — INSULIN REGULAR HUMAN 100 UNIT/ML IJ SOLN
10.0000 [IU] | Freq: Once | INTRAMUSCULAR | Status: DC
  Filled 2020-05-26: qty 3

## 2020-05-26 MED FILL — Medication: Qty: 2 | Status: AC

## 2020-05-27 LAB — URINE CULTURE: Culture: NO GROWTH

## 2020-06-08 LAB — BLOOD GAS, ARTERIAL
Acid-base deficit: 4.6 mmol/L — ABNORMAL HIGH (ref 0.0–2.0)
Acid-base deficit: 6.2 mmol/L — ABNORMAL HIGH (ref 0.0–2.0)
Bicarbonate: 24.1 mmol/L (ref 20.0–28.0)
Bicarbonate: 23 mmol/L (ref 20.0–28.0)
FIO2: 1
FIO2: 1
MECHVT: 500 mL
O2 Saturation: 90.9 %
O2 Saturation: 77.5 %
PEEP: 14 cmH2O
Patient temperature: 37
Patient temperature: 37
RATE: 34 resp/min
pCO2 arterial: 63 mmHg — ABNORMAL HIGH (ref 32.0–48.0)
pCO2 arterial: 66 mmHg (ref 32.0–48.0)
pH, Arterial: 7.19 — CL (ref 7.350–7.450)
pH, Arterial: 7.15 — CL (ref 7.350–7.450)
pO2, Arterial: 75 mmHg — ABNORMAL LOW (ref 83.0–108.0)
pO2, Arterial: 55 mmHg — ABNORMAL LOW (ref 83.0–108.0)

## 2020-06-20 NOTE — Progress Notes (Signed)
Patient has coded 3x this shift so far, and ROSC has been obtained each time with the use of epi and bicarb. The patient's prognosis is still very poor and the chance that he will code again is likely. The significant other, Jackson Stark, has been contacted by Harlon Ditty, NP, and she has agreed to make this patient DNR. Will continue to monitor.  Carmel Sacramento, RN

## 2020-06-20 NOTE — Progress Notes (Signed)
All patient belongings given to [facility] Daughter) per her request.  Carmel Sacramento, RN

## 2020-06-20 NOTE — Significant Event (Signed)
CODE BLUE DOCUMENTATION  01:36 Pt became severely hypoxic (O2 sats 30-40's) and hypotensive (SBP 50's).  Pt given 10 mg Vecuronium and 50 mg Solu-cortef.  Pt then with bradycardia which progressed to PEA.  CPR and ACLS begun immediately.  Pt received 1 amp epi and 1 amp bicarb.  ROSC obtained at 01:39.    Called and updated pt's significant other Shella Maxim regarding latest cardiac arrest event, and that pt is suffering and is actively dying.  We discussed his poor prognosis with slim chance for any meaningful recovery.  Sunny Schlein has decided to make pt a DNR/DNI, and to continue with current treatment measures for now.     Harlon Ditty, AGACNP-BC [facility] Pager: [phone]

## 2020-06-20 NOTE — Death Summary Note (Signed)
DEATH SUMMARY   Patient Details  Name: Jackson Stark MRN: [id number] DOB: 26-May-1961  Admission/Discharge Information   Admit Date:  06-02-2020  Date of Death: Date of Death: 06/13/20  Time of Death: Time of Death: 0445  Length of Stay: 12/17/22  Referring Physician: Patient, No Pcp Per   Reason(s) for Hospitalization  COVID-19 Pneumonia ARDS Acute Hypoxic Respiratory Failure AKI Diabetes Mellitus  Diagnoses  Preliminary cause of death:   COVID-19 Pneumonia Cardiac Arrest Secondary Diagnoses (including complications and co-morbidities):  Active Problems:   Acute hypoxemic respiratory failure due to COVID-19 [facility])   Malnutrition of moderate degree   AKI (acute kidney injury) (HCC)   Generalized weakness   DM (diabetes mellitus), secondary uncontrolled (HCC)   Acute respiratory failure with hypoxia [facility])   Brief Hospital Course (including significant findings, care, treatment, and services provided and events leading to death)  The patient is a 59 year old, former smoker, who presented to [facility] 10 days ago on 2020/06/02 with COVID-19 pneumonia.  He was admitted to and managed in the Covid unit.    On 05/25/2020 the patient developed issues with hypoglycemia and a rapid response was called.  This issue resolved quickly however over the course of the morning the patient had few other rapid response calls with the last calls being about severe hypoxemia.  The patient was transferred to the intensive care unit for further management.  He was noted to be exceedingly tachypneic and obtunded.  He required intubation and mechanical ventilation to protect airway and treat refractory hypoxemia.  The patient now remains in the ICU intubated and mechanically ventilated.  Patient was also noted to have significant hypotension not responsive to fluids requiring multiple vasopressors.    He suffered 3 separate PEA cardiac arrests due to severe hypoxia with the development of multiorgan failure.  He  was noted to have agonal respirations on the vent and was unresponsive post arrests.  After the 3rd PEA arrest, his significant other decided to make him a DNR/DNI, but to continue with all aggressive treatment measures.  Despite all aggressive measures, he expired on 06/13/2020 at 04:45.     Pertinent Labs and Studies  Significant Diagnostic Studies DG Chest 1 View  Result Date: 05/25/2020 CLINICAL DATA:  Shortness of breath.  COVID-19 positive EXAM: CHEST  1 VIEW COMPARISON:  05/20/2020 FINDINGS: Stable cardiomediastinal contours. Persistent patchy airspace opacities throughout both lungs, left worse than right. Findings have worsened within the peripheral aspect of the right mid to lower lung field. No appreciable pleural fluid collection. No pneumothorax. IMPRESSION: Multifocal pneumonia with interval worsening from prior. Electronically Signed   By: Duanne Guess D.O.   On: 05/25/2020 08:02   DG Abd 1 View  Result Date: 05/25/2020 CLINICAL DATA:  OG tube placement EXAM: ABDOMEN - 1 VIEW COMPARISON:  None. FINDINGS: Enteric tube terminates in the proximal gastric body. Nonobstructive bowel gas pattern. Visualized osseous structures are within normal limits. IMPRESSION: Enteric tube terminates in the proximal gastric body. Electronically Signed   By: Charline Bills M.D.   On: 05/25/2020 14:09   US RENAL  Result Date: 05/16/2020 CLINICAL DATA:  Acute kidney injury EXAM: RENAL / URINARY TRACT ULTRASOUND COMPLETE COMPARISON:  None. FINDINGS: Right Kidney: Renal measurements: 11.6 x 6.1 x 4.2 cm = volume: 154 mL. Echogenicity within normal limits. No mass or hydronephrosis visualized. Left Kidney: Renal measurements: 10.1 x 6.1 x 5.1 cm = volume: 162 mL. Echogenicity within normal limits. No mass or hydronephrosis visualized. Bladder: Appears normal  for degree of bladder distention. Other: None. IMPRESSION: Negative renal ultrasound. Electronically Signed   By: Marlan Palau M.D.   On:  05/16/2020 17:06   US Venous Img Lower Bilateral (DVT)  Result Date: 05/21/2020 CLINICAL DATA:  59 year old male with a history of positive D-dimer EXAM: BILATERAL LOWER EXTREMITY VENOUS DOPPLER ULTRASOUND TECHNIQUE: Gray-scale sonography with graded compression, as well as color Doppler and duplex ultrasound were performed to evaluate the lower extremity deep venous systems from the level of the common femoral vein and including the common femoral, femoral, profunda femoral, popliteal and calf veins including the posterior tibial, peroneal and gastrocnemius veins when visible. The superficial great saphenous vein was also interrogated. Spectral Doppler was utilized to evaluate flow at rest and with distal augmentation maneuvers in the common femoral, femoral and popliteal veins. COMPARISON:  None. FINDINGS: RIGHT LOWER EXTREMITY Common Femoral Vein: No evidence of thrombus. Normal compressibility, respiratory phasicity and response to augmentation. Saphenofemoral Junction: No evidence of thrombus. Normal compressibility and flow on color Doppler imaging. Profunda Femoral Vein: No evidence of thrombus. Normal compressibility and flow on color Doppler imaging. Femoral Vein: No evidence of thrombus. Normal compressibility, respiratory phasicity and response to augmentation. Popliteal Vein: No evidence of thrombus. Normal compressibility, respiratory phasicity and response to augmentation. Calf Veins: No evidence of thrombus. Normal compressibility and flow on color Doppler imaging. Superficial Great Saphenous Vein: No evidence of thrombus. Normal compressibility and flow on color Doppler imaging. Other Findings:  Edema LEFT LOWER EXTREMITY Common Femoral Vein: No evidence of thrombus. Normal compressibility, respiratory phasicity and response to augmentation. Saphenofemoral Junction: No evidence of thrombus. Normal compressibility and flow on color Doppler imaging. Profunda Femoral Vein: No evidence of thrombus.  Normal compressibility and flow on color Doppler imaging. Femoral Vein: No evidence of thrombus. Normal compressibility, respiratory phasicity and response to augmentation. Popliteal Vein: No evidence of thrombus. Normal compressibility, respiratory phasicity and response to augmentation. Calf Veins: No evidence of thrombus. Normal compressibility and flow on color Doppler imaging. Superficial Great Saphenous Vein: No evidence of thrombus. Normal compressibility and flow on color Doppler imaging. Other Findings:  Edema IMPRESSION: Sonographic survey of the bilateral lower extremities negative for DVT Electronically Signed   By: Gilmer Mor D.O.   On: 05/21/2020 09:52   DG Chest Port 1 View  Result Date: 19-Jun-2020 CLINICAL DATA:  Initial evaluation for acute hypoxia. EXAM: PORTABLE CHEST 1 VIEW COMPARISON:  Prior radiograph from 05/25/2020. FINDINGS: Endotracheal tube in place with tip positioned 5 cm above the carina. Enteric tube courses in the abdomen. Right IJ approach central venous catheter remains in place with tip overlying the cavoatrial junction. Cardiac and mediastinal silhouettes are stable, and remain within normal limits. Diffuse related pad overlies the chest. Lungs mildly hypoinflated. Persistent fairly extensive and heterogeneous bilateral airspace disease, right slightly worse than left, consistent with history of COVID pneumonia. Overall, appearance is little interval change from previous. Slightly increased underlying pulmonary interstitial congestion from prior. No visible pleural effusion. No pneumothorax. Osseous structures are unchanged. IMPRESSION: 1. Support apparatus in satisfactory position as above. 2. Persistent extensive and heterogeneous bilateral airspace disease, right slightly worse than left, consistent with history of COVID pneumonia. Slightly increased underlying pulmonary interstitial congestion as compared to previous. Electronically Signed   By: Rise Mu  M.D.   On: 2020-06-19 02:19   DG Chest Port 1 View  Result Date: 05/25/2020 CLINICAL DATA:  Central line placement. EXAM: PORTABLE CHEST 1 VIEW COMPARISON:  Earlier this day. FINDINGS: Tip of  the right internal jugular central venous catheter projects over the lower SVC. No pneumothorax. Endotracheal tube and enteric tubes remain in place. Stable appearance of heterogeneous bilateral airspace opacities. Upper normal heart size. No evidence of pneumomediastinum. No large pleural effusion. IMPRESSION: 1. Tip of the right internal jugular central venous catheter projects over the lower SVC. No pneumothorax. 2. Unchanged bilateral heterogeneous airspace opacities, consistent with COVID pneumonia. Electronically Signed   By: Narda RutherfordMelanie  Sanford M.D.   On: 05/25/2020 17:38   DG Chest Port 1 View  Result Date: 05/25/2020 CLINICAL DATA:  Intubation, COVID positive EXAM: PORTABLE CHEST 1 VIEW COMPARISON:  05/25/2020 at 0749 hours FINDINGS: Endotracheal tube terminates 3.5 cm above the carina. Enteric tube courses into the stomach. Multifocal pneumonia with sparing of the lung apices. No pleural effusion or pneumothorax. The heart is normal in size. IMPRESSION: Endotracheal tube terminates 3.5 cm above the carina. Multifocal pneumonia in this patient with known COVID. Electronically Signed   By: Charline BillsSriyesh  Krishnan M.D.   On: 05/25/2020 14:09   DG Chest Port 1 View  Result Date: 05/20/2020 CLINICAL DATA:  Shortness of breath, COVID and history of diabetes EXAM: PORTABLE CHEST 1 VIEW COMPARISON:  May 15, 2020 FINDINGS: Heart size and mediastinal contours are stable accounting for partially obscured LEFT heart border in the setting of worsening LEFT chest opacification since prior study. Interstitial and airspace opacities of also developed in the RIGHT lung since the previous exam. No definite effusion. On limited assessment skeletal structures without acute process. IMPRESSION: Worsening opacification in the  setting of LEFT chest and interstitial and airspace opacities in the RIGHT lung, findings compatible with worsening of multifocal pneumonia in the setting of COVID-19 infection. Electronically Signed   By: Donzetta KohutGeoffrey  Wile M.D.   On: 05/20/2020 08:35   DG Chest Port 1 View  Result Date: 05/14/2020 CLINICAL DATA:  Shortness of breath EXAM: PORTABLE CHEST 1 VIEW COMPARISON:  None. FINDINGS: Cardiac shadow is within normal limits. The lungs are well aerated bilaterally. Diffuse increased density is noted in the left base consistent with early infiltrate. No bony abnormality is noted. IMPRESSION: Early infiltrate in the left base. Electronically Signed   By: Alcide CleverMark  Lukens M.D.   On: 05/14/2020 17:39    Microbiology Recent Results (from the past 240 hour(s))  MRSA PCR Screening     Status: None   Collection Time: 05/25/20 12:15 PM   Specimen: Nasopharyngeal  Result Value Ref Range Status   MRSA by PCR NEGATIVE NEGATIVE Final    Comment:        The GeneXpert MRSA Assay (FDA approved for NASAL specimens only), is one component of a comprehensive MRSA colonization surveillance program. It is not intended to diagnose MRSA infection nor to guide or monitor treatment for MRSA infections. Performed at [facility], [address] [place], [place] [id number]     Lab Basic Metabolic Panel: Recent Labs  Lab 05/20/20 [id number] 05/20/20 0359 05/21/20 0540 05/21/20 0540 05/22/20 0401 05/22/20 0401 05/23/20 0432 05/24/20 0411 05/25/20 0540 05/25/20 1945 02/03/20 0149  NA 137   < > 132*   < > 132*   < > 131* 132* 133* 136 137  K 3.6   < > 3.8   < > 3.7   < > 3.6 3.5 3.7 4.9 5.4*  CL 103   < > 98   < > 97*   < > 95* 97* 100 103 101  CO2 23   < > 22   < >  25   < > 26 23 23 23 24   GLUCOSE 209*   < > 311*   < > 226*   < > 113* 135* 21* 108* 103*  BUN 99*   < > 97*   < > 97*   < > 104* 101* 101* 87* 105*  CREATININE 4.11*   < > 4.15*   < > 4.21*   < > 4.12* 4.29* 4.29* 4.46* 4.74*  CALCIUM  8.0*   < > 8.3*   < > 8.0*   < > 8.0* 8.0* 8.1* 6.8* 6.7*  MG 2.2  --  2.3  --  2.2  --   --   --  2.1  --  2.0  PHOS  --   --   --   --   --   --   --   --  3.1  --  7.5*   < > = values in this interval not displayed.   Liver Function Tests: Recent Labs  Lab 05/20/20 0359 05/25/2020 0149  AST 37 207*  ALT 22 171*  ALKPHOS 69 71  BILITOT 0.7 1.0  PROT 5.1* 4.5*  ALBUMIN 2.2* 1.8*   No results for input(s): LIPASE, AMYLASE in the last 168 hours. No results for input(s): AMMONIA in the last 168 hours. CBC: Recent Labs  Lab 05/20/20 0359 05/21/20 0540 05/23/20 0432 05/24/20 0411 05/25/20 0540 05/25/20 1945 06/08/2020 0149  WBC 8.0   < > 24.8* 11.9* 19.1* 15.2* 20.0*  NEUTROABS 6.8  --   --   --   --  14.5* 19.1*  HGB 10.8*   < > 9.7* 9.7* 11.9* 9.4* 9.7*  HCT 29.1*   < > 27.7* 26.6* 32.5* 28.0* 29.6*  MCV 77.6*   < > 81.2 79.2* 79.9* 84.8 87.1  PLT 191   < > 208 147* 164 183 178   < > = values in this interval not displayed.   Cardiac Enzymes: No results for input(s): CKTOTAL, CKMB, CKMBINDEX, TROPONINI in the last 168 hours. Sepsis Labs: Recent Labs  Lab 05/24/20 0411 05/25/20 0540 05/25/20 1945 06/14/2020 0149  PROCALCITON  --  4.24  --  7.78  WBC 11.9* 19.1* 15.2* 20.0*    Procedures/Operations  05/25/20: Endotracheal Intubation 05/25/20: Right IJ CVC Placement      07/25/20, AGACNP-BC [facility] Pager: [phone]  [phone] 06/07/2020, 5:47 AM

## 2020-06-20 DEATH — deceased

## 2022-05-28 IMAGING — US US EXTREM LOW VENOUS
1 series · 13 of 24 positions shown · non-contrast
Comparison: None.

CLINICAL DATA: 59-year-old male with a history of positive D-dimer



[Series 1: us extrem low venous · 0.08mm/px · 13 of 60 slices shown]
[im 1/60]
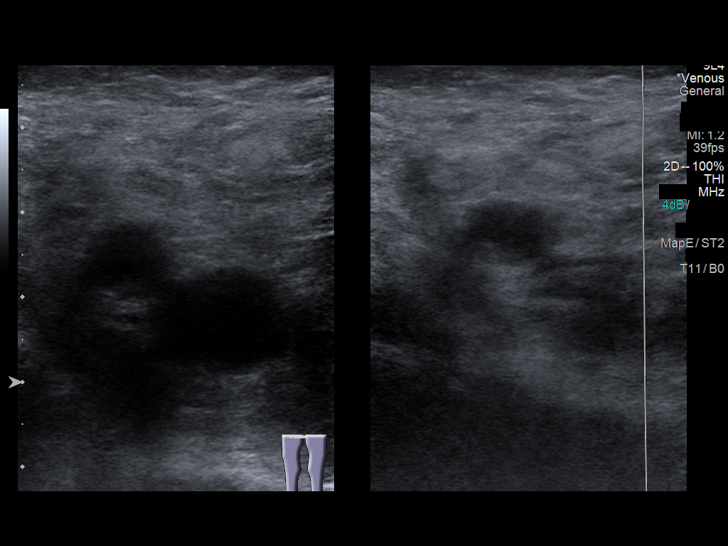
[im 6/60]
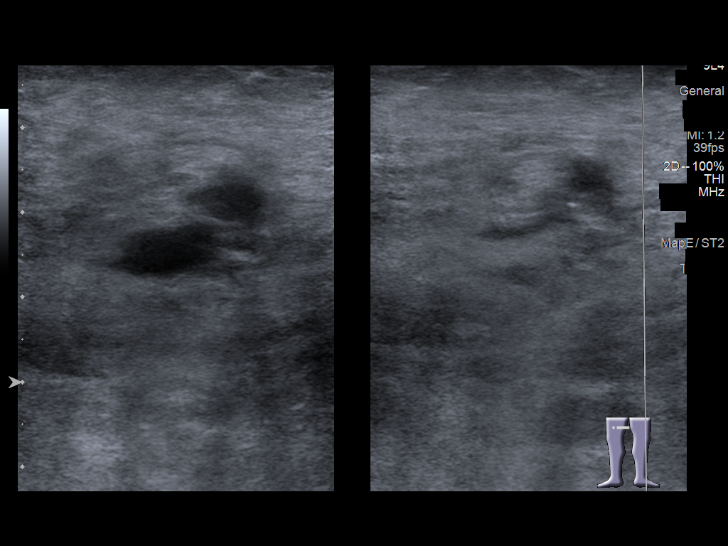
[im 11/60]
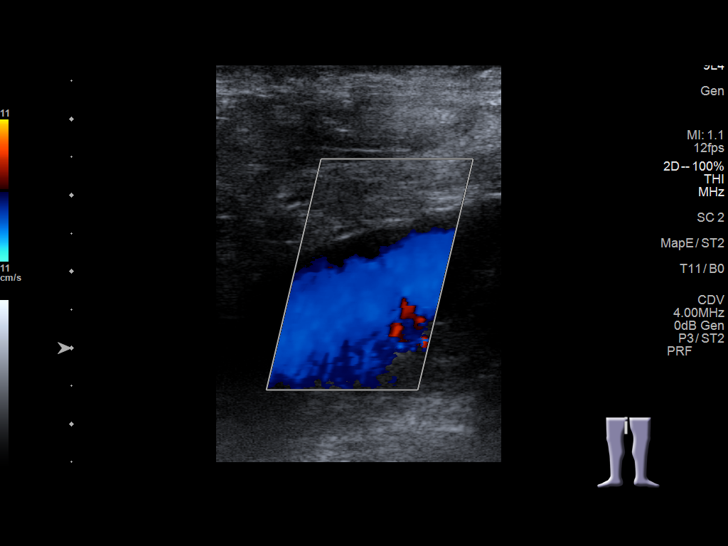
[im 16/60]
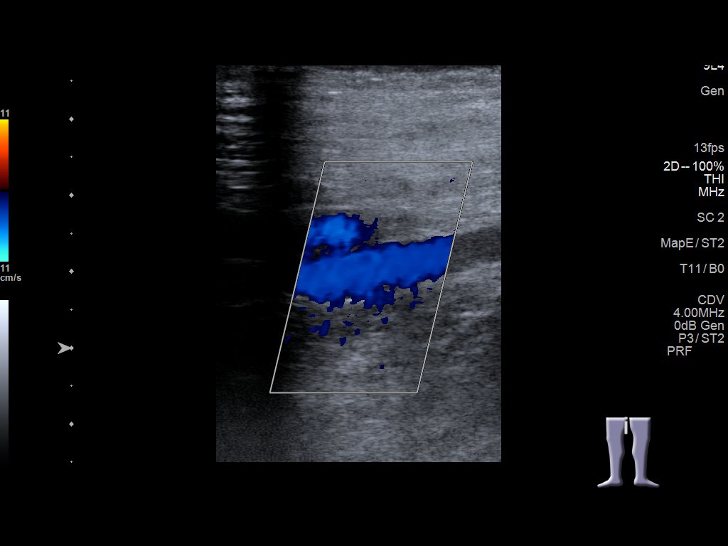
[im 21/60]
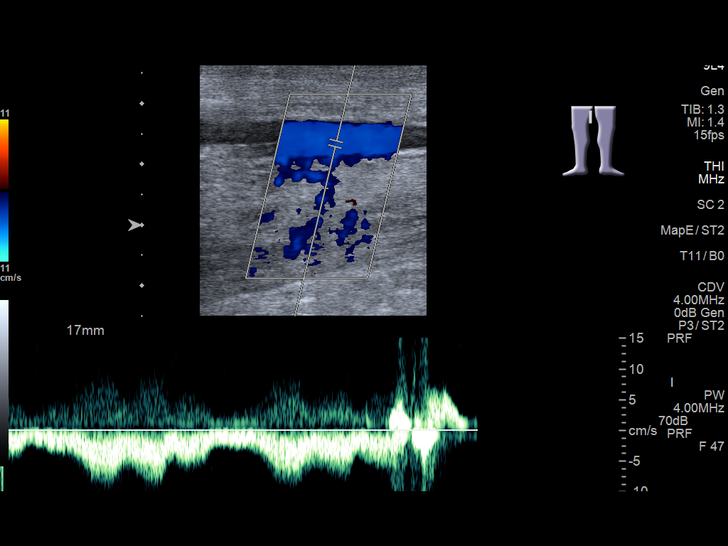
[im 26/60]
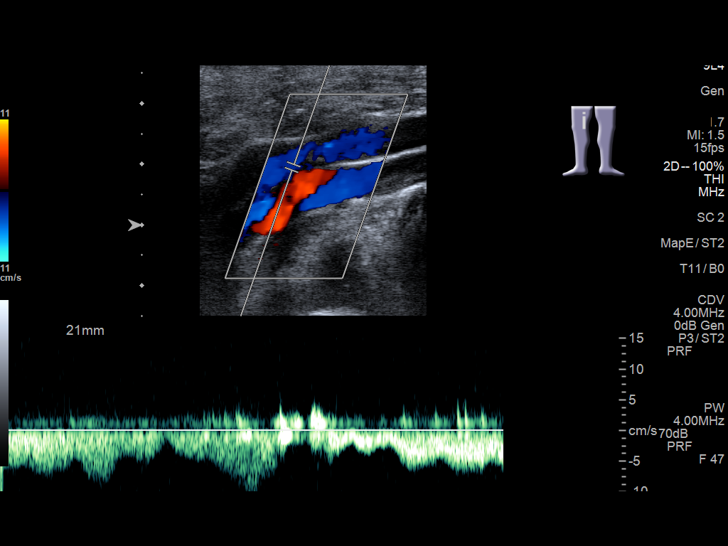
[im 31/60]
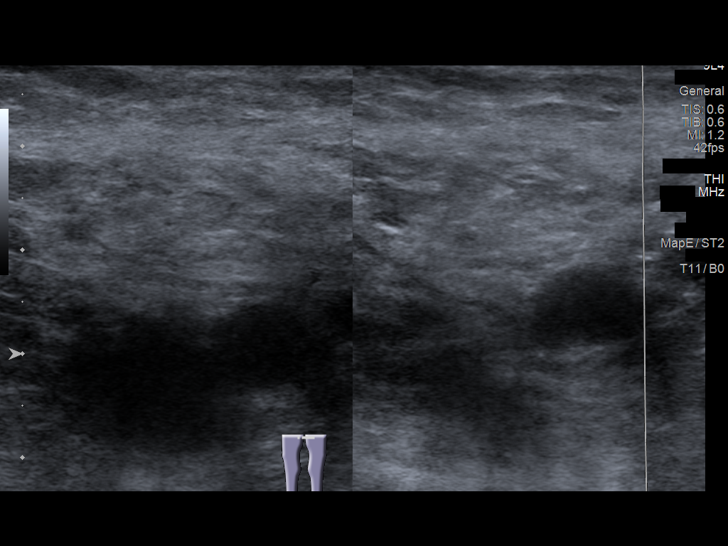
[im 34/60]
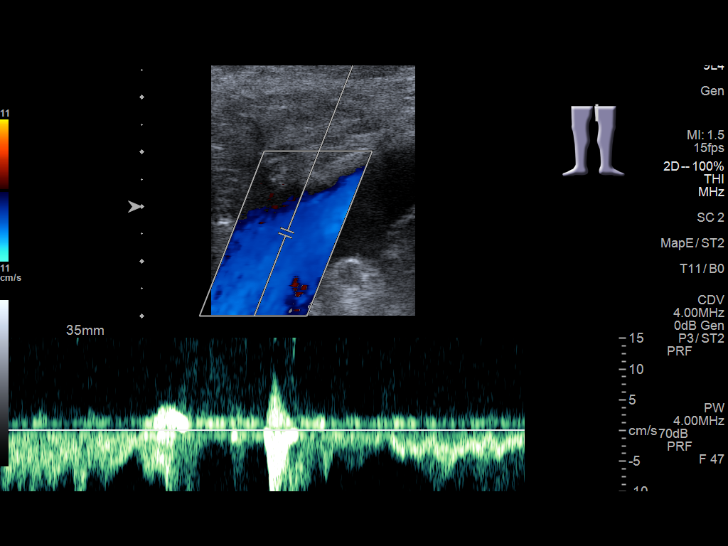
[im 39/60]
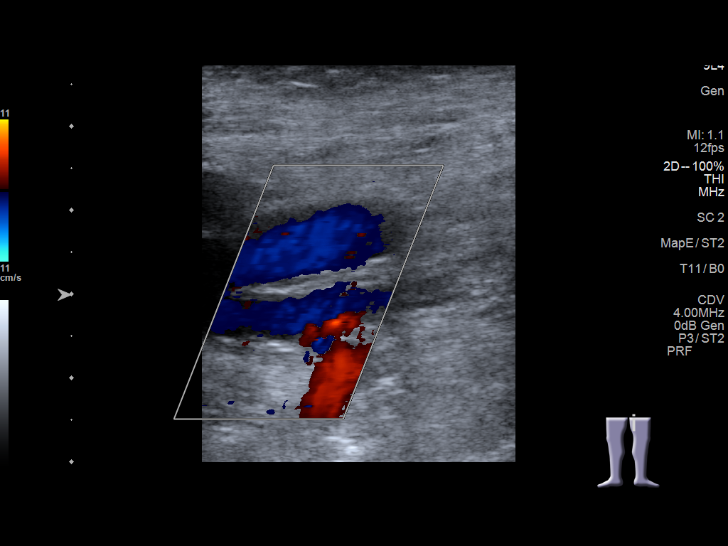
[im 44/60]
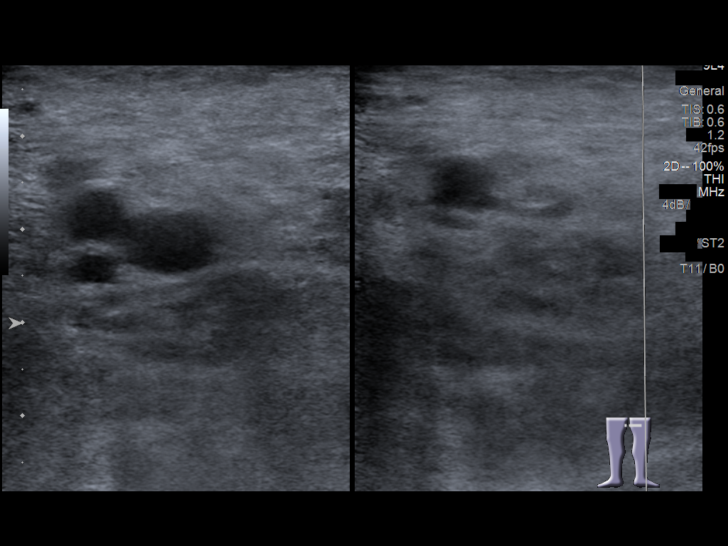
[im 49/60]
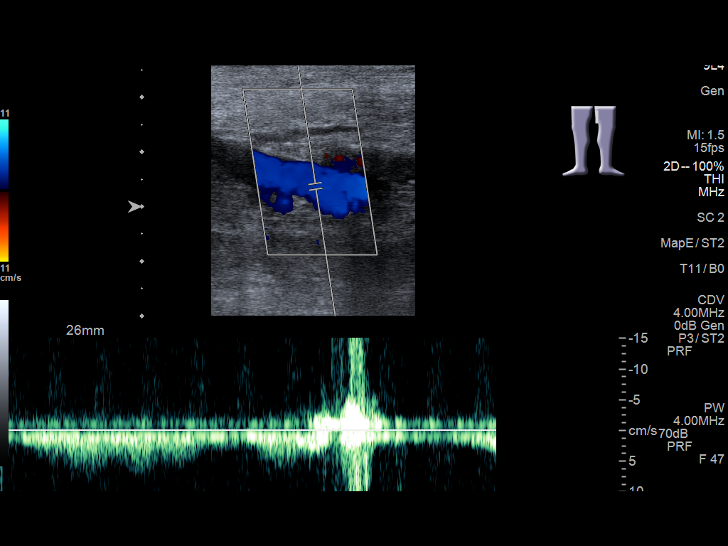
[im 54/60]
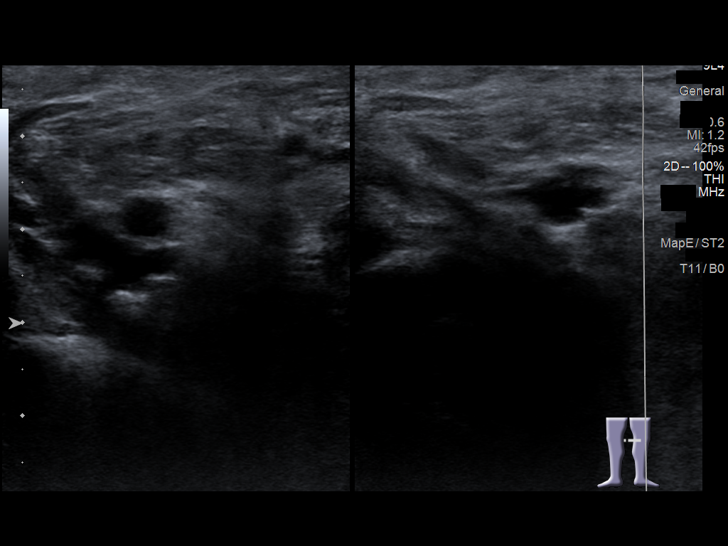
[im 60/60]
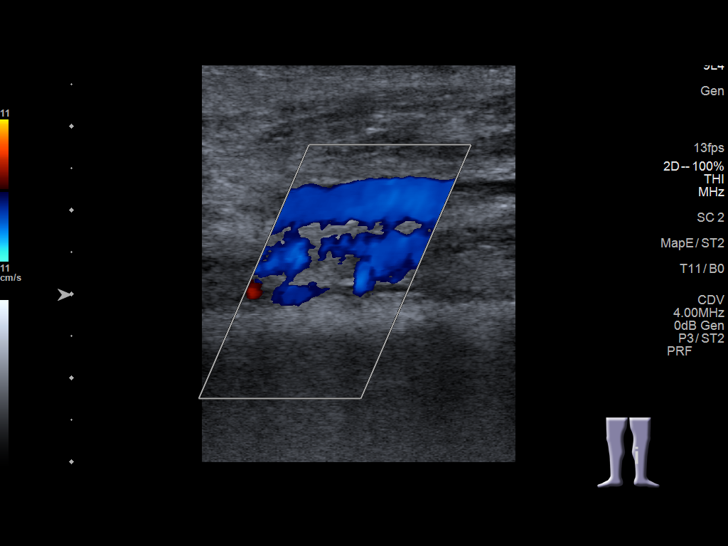

[13 of 24 positions shown; findings below may reference images not displayed]

FINDINGS: RIGHT LOWER EXTREMITY

Common Femoral Vein: No evidence of thrombus. Normal
compressibility, respiratory phasicity and response to augmentation.

Saphenofemoral Junction: No evidence of thrombus. Normal
compressibility and flow on color Doppler imaging.

Profunda Femoral Vein: No evidence of thrombus. Normal
compressibility and flow on color Doppler imaging.

Femoral Vein: No evidence of thrombus. Normal compressibility,
respiratory phasicity and response to augmentation.

Popliteal Vein: No evidence of thrombus. Normal compressibility,
respiratory phasicity and response to augmentation.

Calf Veins: No evidence of thrombus. Normal compressibility and flow
on color Doppler imaging.

Superficial Great Saphenous Vein: No evidence of thrombus. Normal
compressibility and flow on color Doppler imaging.

Other Findings:  Edema

LEFT LOWER EXTREMITY

Common Femoral Vein: No evidence of thrombus. Normal
compressibility, respiratory phasicity and response to augmentation.

Saphenofemoral Junction: No evidence of thrombus. Normal
compressibility and flow on color Doppler imaging.

Profunda Femoral Vein: No evidence of thrombus. Normal
compressibility and flow on color Doppler imaging.

Femoral Vein: No evidence of thrombus. Normal compressibility,
respiratory phasicity and response to augmentation.

Popliteal Vein: No evidence of thrombus. Normal compressibility,
respiratory phasicity and response to augmentation.

Calf Veins: No evidence of thrombus. Normal compressibility and flow
on color Doppler imaging.

Superficial Great Saphenous Vein: No evidence of thrombus. Normal
compressibility and flow on color Doppler imaging.

Other Findings:  Edema
IMPRESSION: Sonographic survey of the bilateral lower extremities negative for
DVT

## 2022-06-02 IMAGING — DX DG ABDOMEN 1V
1 series · 1 of 1 positions shown · non-contrast
Comparison: None.

CLINICAL DATA: OG tube placement

EXAM:
ABDOMEN - 1 VIEW

[abdomen supine]
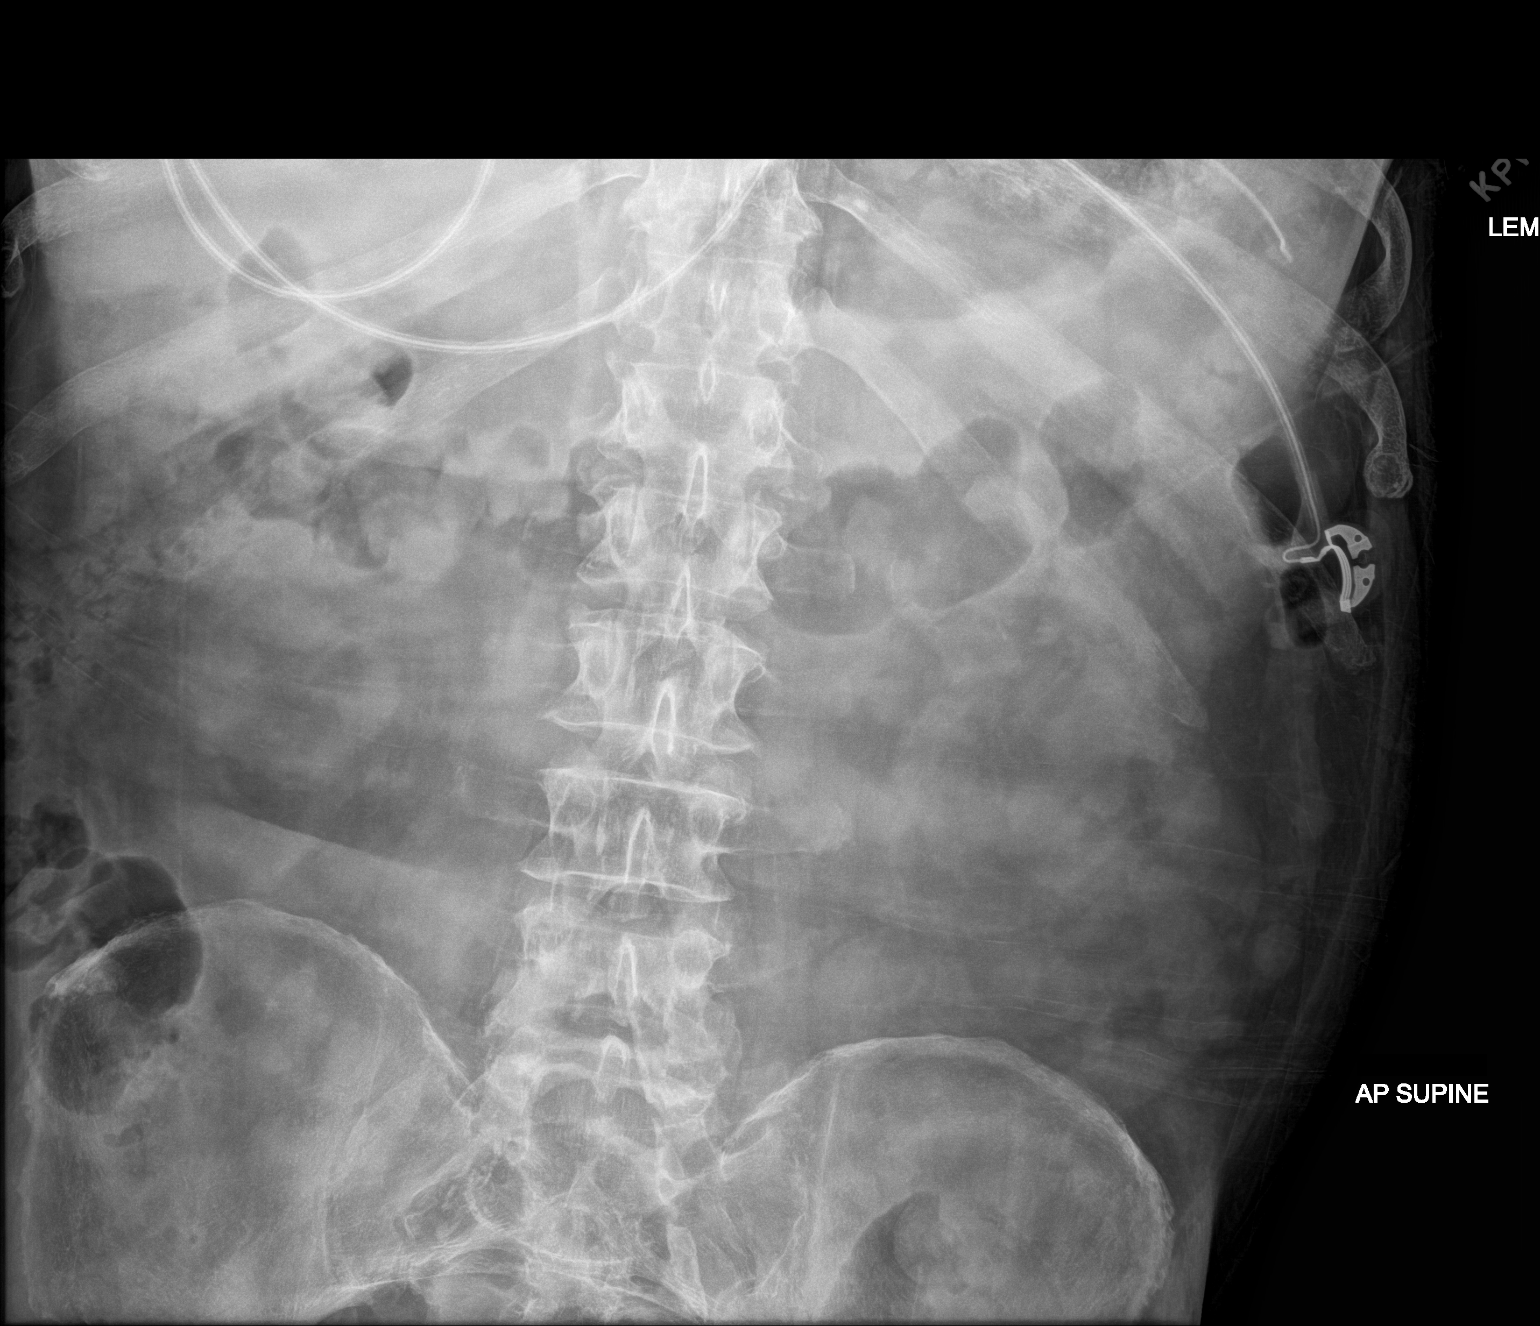

[1 of 1 positions shown; findings below may reference images not displayed]

FINDINGS: Enteric tube terminates in the proximal gastric body.

Nonobstructive bowel gas pattern.

Visualized osseous structures are within normal limits.
IMPRESSION: Enteric tube terminates in the proximal gastric body.

## 2022-06-02 IMAGING — DX DG CHEST 1V PORT
1 series · 1 of 1 positions shown · non-contrast
Comparison: Earlier this day.

CLINICAL DATA: Central line placement.

EXAM:
PORTABLE CHEST 1 VIEW

[chest ap]
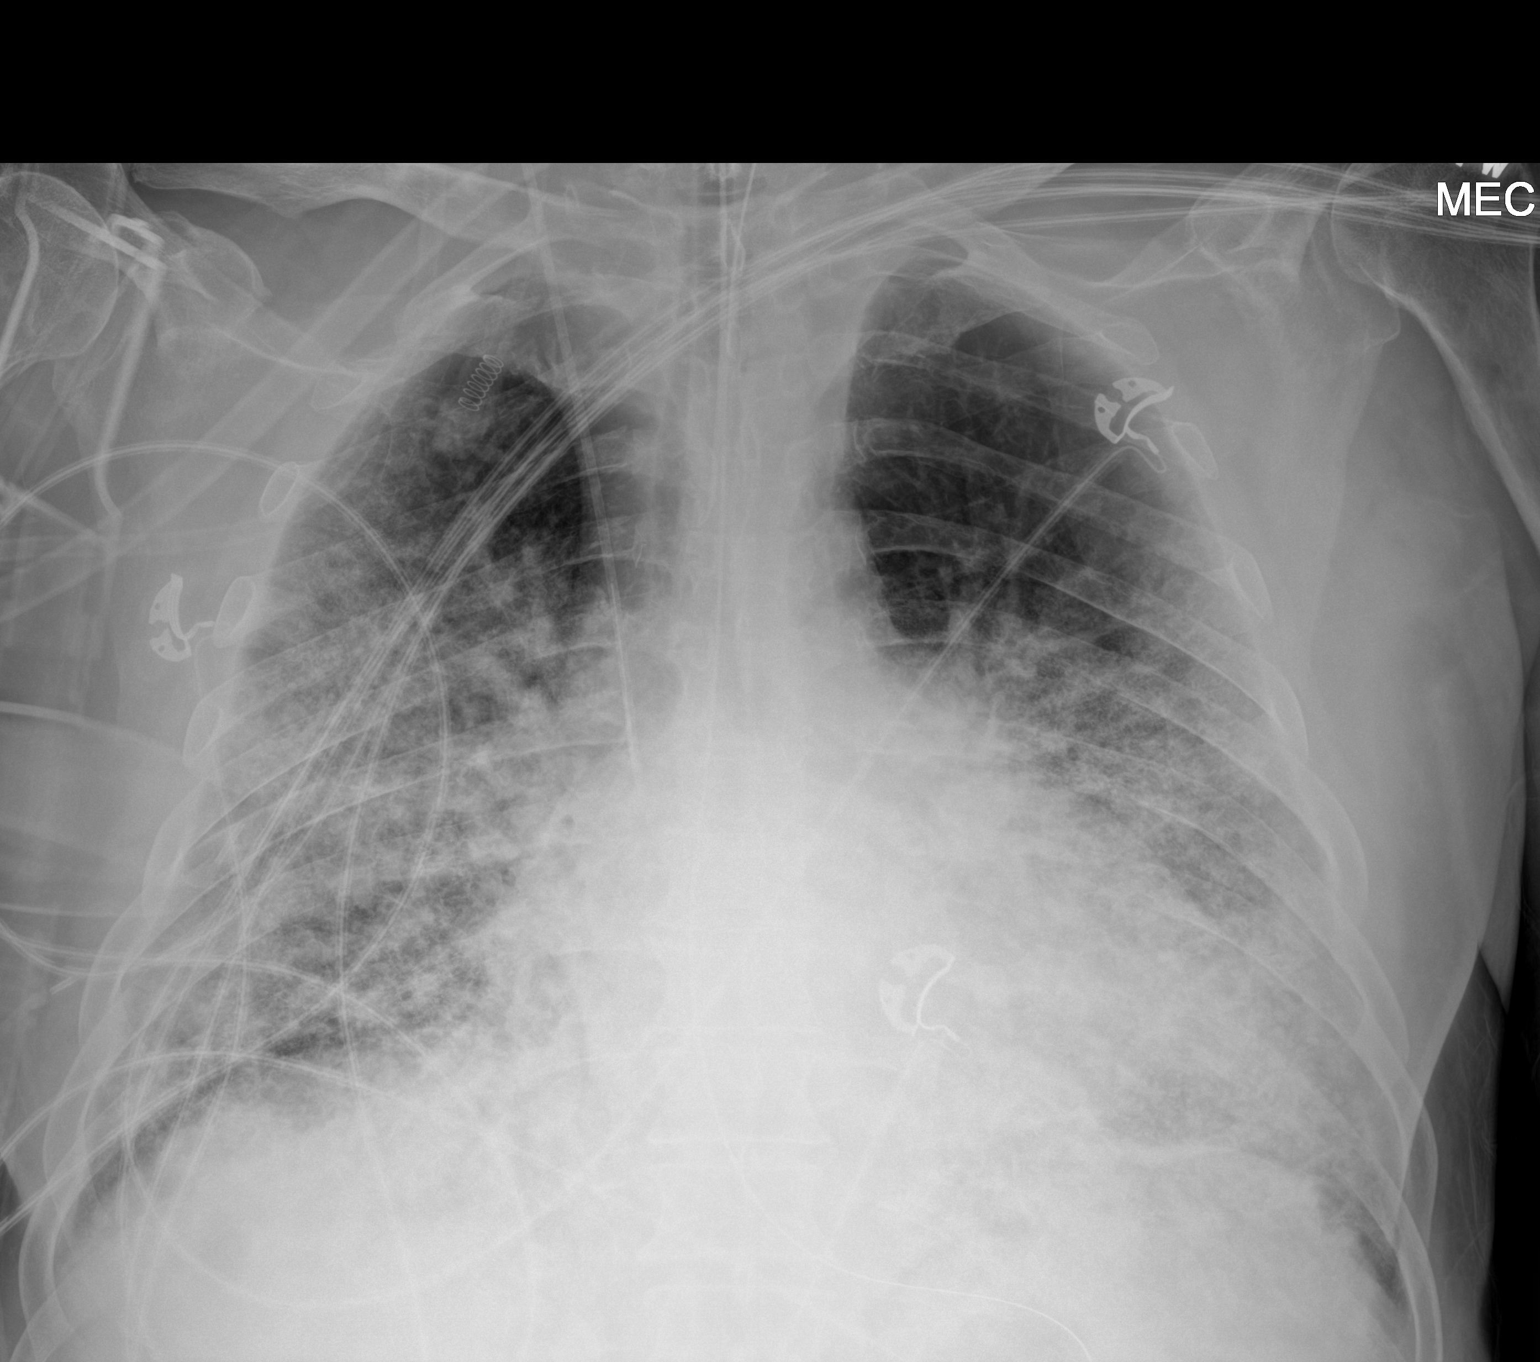

[1 of 1 positions shown; findings below may reference images not displayed]

FINDINGS: Tip of the right internal jugular central venous catheter projects
over the lower SVC. No pneumothorax. Endotracheal tube and enteric
tubes remain in place. Stable appearance of heterogeneous bilateral
airspace opacities. Upper normal heart size. No evidence of
pneumomediastinum. No large pleural effusion.
IMPRESSION: 1. Tip of the right internal jugular central venous catheter
projects over the lower SVC. No pneumothorax.
2. Unchanged bilateral heterogeneous airspace opacities, consistent
with COVID pneumonia.
